# Patient Record
Sex: Female | Born: 1990 | Race: White | Hispanic: No | Smoking: Never smoker
Health system: Southern US, Community
[De-identification: ages and names within clinical notes are randomized; demographics above are authoritative.]

## PROBLEM LIST (undated history)

## (undated) DIAGNOSIS — F32A Depression, unspecified: Secondary | ICD-10-CM

## (undated) DIAGNOSIS — F329 Major depressive disorder, single episode, unspecified: Secondary | ICD-10-CM

## (undated) DIAGNOSIS — O24419 Gestational diabetes mellitus in pregnancy, unspecified control: Secondary | ICD-10-CM

## (undated) DIAGNOSIS — K831 Obstruction of bile duct: Secondary | ICD-10-CM

## (undated) DIAGNOSIS — E282 Polycystic ovarian syndrome: Secondary | ICD-10-CM

## (undated) DIAGNOSIS — O26613 Liver and biliary tract disorders in pregnancy, third trimester: Secondary | ICD-10-CM

## (undated) DIAGNOSIS — Z3490 Encounter for supervision of normal pregnancy, unspecified, unspecified trimester: Secondary | ICD-10-CM

## (undated) DIAGNOSIS — Z8619 Personal history of other infectious and parasitic diseases: Secondary | ICD-10-CM

## (undated) DIAGNOSIS — O26643 Intrahepatic cholestasis of pregnancy, third trimester: Secondary | ICD-10-CM

## (undated) HISTORY — DX: Liver and biliary tract disorders in pregnancy, third trimester: O26.613

## (undated) HISTORY — PX: KIDNEY STONE SURGERY: SHX686

## (undated) HISTORY — DX: Personal history of other infectious and parasitic diseases: Z86.19

## (undated) HISTORY — DX: Intrahepatic cholestasis of pregnancy, third trimester: O26.643

## (undated) HISTORY — PX: CHOLECYSTECTOMY: SHX55

## (undated) HISTORY — PX: WISDOM TOOTH EXTRACTION: SHX21

## (undated) HISTORY — DX: Obstruction of bile duct: K83.1

## (undated) HISTORY — DX: Gestational diabetes mellitus in pregnancy, unspecified control: O24.419

---
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
## 2010-10-03 ENCOUNTER — Other Ambulatory Visit (HOSPITAL_COMMUNITY)
Admission: RE | Admit: 2010-10-03 | Discharge: 2010-10-03 | Disposition: A | Discharge status: Home / Self Care | Payer: Self-pay | Source: Ambulatory Visit | Attending: Family Medicine | Admitting: Family Medicine

## 2010-10-03 ENCOUNTER — Other Ambulatory Visit: Payer: Self-pay

## 2010-10-03 ENCOUNTER — Other Ambulatory Visit (HOSPITAL_COMMUNITY)
Admission: RE | Admit: 2010-10-03 | Discharge: 2010-10-03 | Disposition: A | Discharge status: Home / Self Care | Payer: Self-pay | Source: Ambulatory Visit | Attending: Unknown Physician Specialty | Admitting: Unknown Physician Specialty

## 2010-10-03 DIAGNOSIS — R87612 Low grade squamous intraepithelial lesion on cytologic smear of cervix (LGSIL): Secondary | ICD-10-CM | POA: Insufficient documentation

## 2010-10-03 DIAGNOSIS — R87619 Unspecified abnormal cytological findings in specimens from cervix uteri: Secondary | ICD-10-CM | POA: Insufficient documentation

## 2014-07-28 ENCOUNTER — Inpatient Hospital Stay (HOSPITAL_COMMUNITY)
Admission: RE | Admit: 2014-07-28 | Discharge: 2014-07-30 | DRG: 885 | Disposition: A | Discharge status: Home / Self Care | Payer: 59 | Attending: Psychiatry | Admitting: Psychiatry

## 2014-07-28 ENCOUNTER — Ambulatory Visit (HOSPITAL_COMMUNITY)
Admission: EM | Admit: 2014-07-28 | Discharge: 2014-07-28 | Disposition: A | Discharge status: Home / Self Care | Payer: Self-pay | Source: Home / Self Care | Attending: Psychiatry | Admitting: Psychiatry

## 2014-07-28 ENCOUNTER — Encounter (HOSPITAL_COMMUNITY): Payer: Self-pay | Admitting: *Deleted

## 2014-07-28 DIAGNOSIS — G4700 Insomnia, unspecified: Secondary | ICD-10-CM | POA: Diagnosis present

## 2014-07-28 DIAGNOSIS — F332 Major depressive disorder, recurrent severe without psychotic features: Secondary | ICD-10-CM | POA: Diagnosis present

## 2014-07-28 DIAGNOSIS — R45851 Suicidal ideations: Secondary | ICD-10-CM | POA: Diagnosis present

## 2014-07-28 HISTORY — DX: Polycystic ovarian syndrome: E28.2

## 2014-07-28 LAB — CBC
HEMATOCRIT: 43.5 % (ref 36.0–46.0)
Hemoglobin: 14.7 g/dL (ref 12.0–15.0)
MCH: 30.4 pg (ref 26.0–34.0)
MCHC: 33.8 g/dL (ref 30.0–36.0)
MCV: 90.1 fL (ref 78.0–100.0)
PLATELETS: 326 10*3/uL (ref 150–400)
RBC: 4.83 MIL/uL (ref 3.87–5.11)
RDW: 12.6 % (ref 11.5–15.5)
WBC: 9.2 10*3/uL (ref 4.0–10.5)

## 2014-07-28 LAB — URINALYSIS, ROUTINE W REFLEX MICROSCOPIC
BILIRUBIN URINE: NEGATIVE
Glucose, UA: NEGATIVE mg/dL
Hgb urine dipstick: NEGATIVE
Ketones, ur: NEGATIVE mg/dL
Leukocytes, UA: NEGATIVE
Nitrite: NEGATIVE
Protein, ur: NEGATIVE mg/dL
Specific Gravity, Urine: 1.018 (ref 1.005–1.030)
UROBILINOGEN UA: 0.2 mg/dL (ref 0.0–1.0)
pH: 5.5 (ref 5.0–8.0)

## 2014-07-28 LAB — BASIC METABOLIC PANEL
Anion gap: 9 (ref 5–15)
BUN: 7 mg/dL (ref 6–23)
CALCIUM: 8.7 mg/dL (ref 8.4–10.5)
CO2: 25 mmol/L (ref 19–32)
CREATININE: 0.71 mg/dL (ref 0.50–1.10)
Chloride: 113 mmol/L — ABNORMAL HIGH (ref 96–112)
GFR calc non Af Amer: >90 mL/min (ref >90)
Glucose, Bld: 104 mg/dL — ABNORMAL HIGH (ref 70–99)
Potassium: 3.5 mmol/L (ref 3.5–5.1)
Sodium: 147 mmol/L — ABNORMAL HIGH (ref 135–145)

## 2014-07-28 LAB — LIPID PANEL
CHOL/HDL RATIO: 4.8 ratio
CHOLESTEROL: 159 mg/dL (ref 0–200)
HDL: 33 mg/dL — AB (ref >39)
LDL Cholesterol: 84 mg/dL (ref 0–99)
TRIGLYCERIDES: 211 mg/dL — AB (ref <150)
VLDL: 42 mg/dL — AB (ref 0–40)

## 2014-07-28 LAB — TSH: TSH: 2.349 u[IU]/mL (ref 0.350–4.500)

## 2014-07-28 LAB — HEPATIC FUNCTION PANEL
ALBUMIN: 4 g/dL (ref 3.5–5.2)
ALT: 35 U/L (ref 0–35)
AST: 29 U/L (ref 0–37)
Alkaline Phosphatase: 92 U/L (ref 39–117)
Bilirubin, Direct: 0.1 mg/dL (ref 0.0–0.5)
Indirect Bilirubin: 0.6 mg/dL (ref 0.3–0.9)
Total Bilirubin: 0.7 mg/dL (ref 0.3–1.2)
Total Protein: 7.7 g/dL (ref 6.0–8.3)

## 2014-07-28 LAB — PREGNANCY, URINE: Preg Test, Ur: NEGATIVE

## 2014-07-28 MED ORDER — ACETAMINOPHEN 325 MG PO TABS
650.0000 mg | ORAL_TABLET | Freq: Four times a day (QID) | ORAL | Status: DC | PRN
  Administered 2014-07-28 – 2014-07-29 (×3): 650 mg via ORAL
  Filled 2014-07-28 (×3): qty 2

## 2014-07-28 MED ORDER — TRAZODONE HCL 50 MG PO TABS
50.0000 mg | ORAL_TABLET | Freq: Every evening | ORAL | Status: DC | PRN
  Administered 2014-07-28 – 2014-07-29 (×2): 50 mg via ORAL
  Filled 2014-07-28 (×6): qty 1

## 2014-07-28 MED ORDER — ONDANSETRON 4 MG PO TBDP
4.0000 mg | ORAL_TABLET | Freq: Three times a day (TID) | ORAL | Status: DC | PRN
  Administered 2014-07-28: 4 mg via ORAL
  Filled 2014-07-28: qty 1

## 2014-07-28 MED ORDER — LORAZEPAM 1 MG PO TABS: 1.0000 mg | ORAL_TABLET | Freq: Every day | ORAL | Status: DC

## 2014-07-28 MED ORDER — LORAZEPAM 1 MG PO TABS: 1.0000 mg | ORAL_TABLET | Freq: Three times a day (TID) | ORAL | Status: DC

## 2014-07-28 MED ORDER — HYDROXYZINE HCL 25 MG PO TABS
25.0000 mg | ORAL_TABLET | Freq: Four times a day (QID) | ORAL | Status: DC | PRN
  Administered 2014-07-28 – 2014-07-30 (×2): 25 mg via ORAL
  Filled 2014-07-28 (×2): qty 1
  Filled 2014-07-28: qty 6

## 2014-07-28 MED ORDER — ONDANSETRON 4 MG PO TBDP
4.0000 mg | ORAL_TABLET | Freq: Four times a day (QID) | ORAL | Status: DC | PRN
  Filled 2014-07-28: qty 1

## 2014-07-28 MED ORDER — LORAZEPAM 1 MG PO TABS
1.0000 mg | ORAL_TABLET | Freq: Four times a day (QID) | ORAL | Status: DC
  Filled 2014-07-28 (×5): qty 1

## 2014-07-28 MED ORDER — ALUM & MAG HYDROXIDE-SIMETH 200-200-20 MG/5ML PO SUSP: 30.0000 mL | ORAL | Status: DC | PRN

## 2014-07-28 MED ORDER — HYDROXYZINE HCL 25 MG PO TABS
25.0000 mg | ORAL_TABLET | Freq: Four times a day (QID) | ORAL | Status: DC | PRN
  Filled 2014-07-28: qty 1

## 2014-07-28 MED ORDER — TRAZODONE HCL 50 MG PO TABS
50.0000 mg | ORAL_TABLET | Freq: Every evening | ORAL | Status: DC | PRN
  Filled 2014-07-28 (×2): qty 1

## 2014-07-28 MED ORDER — LORAZEPAM 1 MG PO TABS
1.0000 mg | ORAL_TABLET | Freq: Four times a day (QID) | ORAL | Status: DC | PRN
  Filled 2014-07-28: qty 1

## 2014-07-28 MED ORDER — MAGNESIUM HYDROXIDE 400 MG/5ML PO SUSP: 30.0000 mL | Freq: Every day | ORAL | Status: DC | PRN

## 2014-07-28 MED ORDER — MAGNESIUM HYDROXIDE 400 MG/5ML PO SUSP
30.0000 mL | Freq: Every day | ORAL | Status: DC | PRN
  Filled 2014-07-28: qty 30

## 2014-07-28 MED ORDER — LORAZEPAM 1 MG PO TABS: 1.0000 mg | ORAL_TABLET | Freq: Two times a day (BID) | ORAL | Status: DC

## 2014-07-28 MED ORDER — ALUM & MAG HYDROXIDE-SIMETH 200-200-20 MG/5ML PO SUSP
30.0000 mL | ORAL | Status: DC | PRN
  Filled 2014-07-28: qty 30

## 2014-07-28 MED ORDER — THIAMINE HCL 100 MG/ML IJ SOLN
100.0000 mg | Freq: Once | INTRAMUSCULAR | Status: DC
  Filled 2014-07-28: qty 1

## 2014-07-28 MED ORDER — VITAMIN B-1 100 MG PO TABS
100.0000 mg | ORAL_TABLET | Freq: Every day | ORAL | Status: DC
  Filled 2014-07-28: qty 1

## 2014-07-28 MED ORDER — LOPERAMIDE HCL 2 MG PO CAPS
2.0000 mg | ORAL_CAPSULE | ORAL | Status: DC | PRN
  Filled 2014-07-28: qty 2

## 2014-07-28 MED ORDER — ACETAMINOPHEN 325 MG PO TABS
650.0000 mg | ORAL_TABLET | Freq: Four times a day (QID) | ORAL | Status: DC | PRN
  Filled 2014-07-28: qty 2

## 2014-07-28 MED ORDER — ADULT MULTIVITAMIN W/MINERALS CH
1.0000 | ORAL_TABLET | Freq: Every day | ORAL | Status: DC
  Filled 2014-07-28 (×2): qty 1

## 2014-07-28 MED ORDER — ESCITALOPRAM OXALATE 5 MG PO TABS
5.0000 mg | ORAL_TABLET | Freq: Every day | ORAL | Status: DC
  Administered 2014-07-28 – 2014-07-29 (×2): 5 mg via ORAL
  Filled 2014-07-28 (×3): qty 1
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
## 2014-07-28 NOTE — Tx Team (Signed)
Initial Interdisciplinary Treatment Plan   PATIENT STRESSORS: Marital or family conflict Substance abuse   PATIENT STRENGTHS: Ability for insight Average or above average intelligence Communication skills Motivation for treatment/growth Supportive family/friends Work skills   PROBLEM LIST: Problem List/Patient Goals Date to be addressed Date deferred Reason deferred Estimated date of resolution  Depression "to be put on a antidepressant" 07/28/14   At d/c  anxiety 07/28/14   At d/c  Suicidal ideation 07/28/14   At d/c  Substance abuse 07/28/14   At d/c                                 DISCHARGE CRITERIA:  Improved stabilization in mood, thinking, and/or behavior Motivation to continue treatment in a less acute level of care Need for constant or close observation no longer present  PRELIMINARY DISCHARGE PLAN: Outpatient therapy Return to previous living arrangement Return to previous work or school arrangements  PATIENT/FAMIILY INVOLVEMENT: This treatment plan has been presented to and reviewed with the patient, Lauren Ross. The patient and family have been given the opportunity to ask questions and make suggestions.  Celene KrasRobinson, Khoury Siemon G 07/28/2014, 4:35 AM

## 2014-07-28 NOTE — Progress Notes (Signed)
D: Patient is alert and oriented. Pt's mood and affect is depressed and blunted. Pt is pleasant upon interaction. Pt denies SI/HI and AVH. Pt rates depression 6/10, hopelessness and anxiety 5/10. Pt reports her goal for the day is "accepting my situation." Pt states she received PRN medication earlier today which she experienced relief from. Pt is attending some unit groups. A: Active listening by RN. Encouragement/Support provided to pt. 15 minute checks continued per protocol for patient safety.  R: Patient cooperative and receptive to nursing interventions. Pt remains safe.

## 2014-07-28 NOTE — Clinical Social Work Note (Signed)
CSW contacted pt's place of employment per her request [employer] [phone] in order to inform her supervisor that she is currently hospitalized and will be unable to come to work today. CSW spoke with Dorathy DaftKayla who stated that they would count her as a call out.   The Sherwin-WilliamsHeather Smart, LCSWA 07/28/2014 3:31 PM

## 2014-07-28 NOTE — Progress Notes (Signed)
Adult Psychoeducational Group Note  Date:  07/28/2014 Time:  8:00 pm  Group Topic/Focus:  Wrap-Up Group:   The focus of this group is to help patients review their daily goal of treatment and discuss progress on daily workbooks.  Participation Level:  Active  Participation Quality:  Appropriate, Sharing and Supportive  Affect:  Appropriate  Cognitive:  Appropriate  Insight: Appropriate  Engagement in Group:  Engaged  Modes of Intervention:  Discussion, Education, Socialization and Support  Additional Comments:  Pt stated that her goal for the day was to accept her situation. Pt stated that she feels like she has made progress with the goal. Pt stated that she is feeling better than when she first came into the hospital.   Laural BenesJohnson, Veva HolesLamount 07/28/2014, 11:33 PM
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
## 2014-07-28 NOTE — Progress Notes (Addendum)
D: Pt presents flat in affect and depressed in mood. Pt is currently negative for any SI/HI/AVH. Pt is hoping for things to improve so she can focus on school. Pt reports to be adjusting well to the unit. Pt is visible within milieu but with minimal interactions with others.Pt reports ongoing nausea with no relief from ginger ale.  A: On-cal extender contacted for an order of Zofran. Zofran ordered and administered to pt. Continued support and availability as needed was extended to this pt. Staff continue to monitor pt with q7715min checks.  R: No adverse drug reactions noted. Pt receptive to treatment. Pt remains safe at this time.
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
## 2014-07-28 NOTE — [facility] Group Notes (Signed)
[facility]   07/28/2014 9:20 AM  Participation Quality:  Invited-DID NOT ATTEND. Pt chose to remain in bed.   Smart, American FinancialHeather LCSWA

## 2014-07-28 NOTE — Progress Notes (Signed)
Recreation Therapy Notes  Date: 04.13.2016 Time: 9:30am Location: [address] Group Room   Group Topic: Stress Management  Goal Area(s) Addresses:  Patient will actively participate in stress management techniques presented during session.   Behavioral Response: Did not attend.   Fintan Grater L Sullivan Blasing, LRT/CTRS  Makalynn Berwanger L 07/28/2014 3:17 PM 
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
## 2014-07-28 NOTE — [facility] Group Notes (Signed)
[facility] LCSW Group Therapy  07/28/2014 1:00 PM  Type of Therapy:  Group Therapy  Participation Level:  Active  Participation Quality:  Attentive  Affect:  Appropriate  Cognitive:  Alert and Oriented  Insight:  Engaged  Engagement in Therapy:     Modes of Intervention:  Confrontation, Discussion, Education, Exploration, Problem-solving, Rapport Building, Socialization and Support  Summary of Progress/Problems: Emotion Regulation: This group focused on both positive and negative emotion identification and allowed group members to process ways to identify feelings, regulate negative emotions, and find healthy ways to manage internal/external emotions. Group members were asked to reflect on a time when their reaction to an emotion led to a negative outcome and explored how alternative responses using emotion regulation would have benefited them. Group members were also asked to discuss a time when emotion regulation was utilized when a negative emotion was experienced. Lauren Ross was attentive and engaged during today's processing group. She shared that she struggles with depression and anger that stem from stress. Lauren Ross identified her main stressors as "school and my husband." She became tearful when explaining the lack of communication and feeling unloved by her husband. She shows progress in the group setting and improving insight AEB her ability to identify triggers and bad coping skills (turning to cutting or alcohol use when angry/depressed), and her ability to identify the need for better self care and coping skills. "I need to start seeing a therapist and be put on depression medication."   Smart, Lauren Ross LCSWA 07/28/2014, 1:00 PM

## 2014-07-29 LAB — URINE DRUGS OF ABUSE SCREEN W ALC, ROUTINE (REF LAB)
AMPHETAMINE SCRN UR: NEGATIVE
Barbiturate Quant, Ur: NEGATIVE
Benzodiazepines.: NEGATIVE
COCAINE METABOLITES: NEGATIVE
Creatinine,U: 163.4 mg/dL
Ethyl Alcohol: 31 mg/dL — ABNORMAL HIGH (ref <10)
Marijuana Metabolite: NEGATIVE
Methadone: NEGATIVE
Opiate Screen, Urine: NEGATIVE
PHENCYCLIDINE (PCP): NEGATIVE
PROPOXYPHENE: NEGATIVE

## 2014-07-29 LAB — HEMOGLOBIN A1C
Hgb A1c MFr Bld: 5.6 % (ref 4.8–5.6)
Mean Plasma Glucose: 114 mg/dL

## 2014-07-29 MED ORDER — ESCITALOPRAM OXALATE 10 MG PO TABS
10.0000 mg | ORAL_TABLET | Freq: Every day | ORAL | Status: DC
  Administered 2014-07-30: 10 mg via ORAL
  Filled 2014-07-29 (×2): qty 1

## 2014-07-29 NOTE — [facility] Suicide Risk Assessment (Signed)
[facility] INPATIENT:  Family/Significant Other Suicide Prevention Education  Suicide Prevention Education:  Education Completed; Lauren PattersonMary Ross (pt's mother) [phone] has been identified by the patient as the family member/significant other with whom the patient will be residing, and identified as the person(s) who will aid the patient in the event of a mental health crisis (suicidal ideations/suicide attempt).  With written consent from the patient, the family member/significant other has been provided the following suicide prevention education, prior to the and/or following the discharge of the patient.  The suicide prevention education provided includes the following:  Suicide risk factors  Suicide prevention and interventions  National Suicide Hotline telephone number  [facility] telephone number  [facility] Assistance 911  [facility] and/or [facility]idential Mobile Crisis [facility] telephone number  Request made of family/significant other to:  Remove weapons (e.g., guns, rifles, knives), all items previously/currently identified as safety concern.    Remove drugs/medications (over-the-counter, prescriptions, illicit drugs), all items previously/currently identified as a safety concern.  The family member/significant other verbalizes understanding of the suicide prevention education information provided.  The family member/significant other agrees to remove the items of safety concern listed above.  Smart, Lauren Ross LCSWA  07/29/2014, 3:13 PM
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
## 2014-07-29 NOTE — Progress Notes (Signed)
Patient ID: Lauren Ross, female   DOB: Apr 12, 1991, 24 y.o.   MRN: [id number]  Pt currently presents with a flat affect and depressed behavior. Per self inventory, pt rates depression at a 4, hopelessness 3 and anxiety 2. Pt's daily goal is to "go to group and work towards my normal self" and they intend to do so by "talk and read." Pt reports good sleep/concentration and a fair appetite.   Pt provided with medications per providers orders. Pt's labs and vitals were monitored throughout the day. Pt supported emotionally and encouraged to express concerns and questions. Pt educated on medications and depression.  Pt's safety ensured with 15 minute and environmental checks. Pt currently denies SI/HI and A/V hallucinations. Pt verbally agrees to seek staff if SI/HI or A/VH occurs and to consult with staff before acting on these thoughts.

## 2014-07-29 NOTE — Progress Notes (Signed)
[facility] MD Progress Note  07/29/2014 1:19 PM Lauren Ross  MRN:  [id number] Subjective:  Has not heard from her husband. States she found out that he never told her mother or her brother that she was being admitted as she asked him to do. She states that the frustration has turned into anger. She states she still loves him but does not see a future for their relationship. By some changes in his behavior during the last several months she thinks he is having an affair. She states she is working towards not letting what is going on affect her schooling. She wants to get focused on the plans she has for herself. Principal Problem: MDD (major depressive disorder), recurrent episode, severe Diagnosis:   Patient Active Problem List   Diagnosis Date Noted  . MDD (major depressive disorder), recurrent episode, severe [F33.2] 07/28/2014   Total Time spent with patient: 30 minutes   Past Medical History:  Past Medical History  Diagnosis Date  . Polycystic ovary syndrome    History reviewed. No pertinent past surgical history. Family History: History reviewed. No pertinent family history. Social History:  History  Alcohol Use  . Yes     History  Drug Use Not on file    History   Social History  . Marital Status: Unknown    Spouse Name: N/A  . Number of Children: N/A  . Years of Education: N/A   Social History Main Topics  . Smoking status: Never Smoker   . Smokeless tobacco: Not on file  . Alcohol Use: Yes  . Drug Use: Not on file  . Sexual Activity: Yes   Other Topics Concern  . None   Social History Narrative  . None   Additional History:    Sleep: Fair  Appetite:  Fair   Assessment:   Musculoskeletal: Strength & Muscle Tone: within normal limits Gait & Station: normal Patient leans: N/A   Psychiatric Specialty Exam: Physical Exam  Review of Systems  Constitutional: Negative.   HENT: Negative.   Eyes: Negative.   Respiratory: Negative.    Cardiovascular: Negative.   Gastrointestinal: Negative.   Genitourinary: Negative.   Musculoskeletal: Negative.   Skin: Negative.   Neurological: Negative.   Endo/Heme/Allergies: Negative.   Psychiatric/Behavioral: Positive for depression. The patient is nervous/anxious.     Blood pressure 111/64, pulse 93, temperature 97.7 F (36.5 C), temperature source Oral, resp. rate 16, height _0  (1.575 m), weight 102.059 kg (225 lb), last menstrual period 07/28/2014.Body mass index is 41.14 kg/(m^2).  General Appearance: Fairly Groomed  Engineer, water::  Fair  Speech:  Clear and Coherent  Volume:  fluctuates  Mood:  Anxious and Depressed  Affect:  Restricted  Thought Process:  Coherent and Goal Directed  Orientation:  Full (Time, Place, and Person)  Thought Content:  symptoms events worries concerns  Suicidal Thoughts:  No  Homicidal Thoughts:  No  Memory:  Immediate;   Fair Recent;   Fair Remote;   Fair  Judgement:  Fair  Insight:  Present  Psychomotor Activity:  Normal  Concentration:  Fair  Recall:  [employer] of Knowledge:Fair  Language: Fair  Akathisia:  No  Handed:  Right  AIMS (if indicated):     Assets:  Desire for Improvement Housing Social Support Vocational/Educational  ADL's:  Intact  Cognition: WNL  Sleep:  Number of Hours: 6.75     Current Medications: Current Facility-Administered Medications  Medication Dose Route Frequency Provider Last Rate Last Dose  .  acetaminophen (TYLENOL) tablet 650 mg  650 mg Oral Q6H PRN Laverle Hobby, PA-C   650 mg at 07/29/14 1214  . alum & mag hydroxide-simeth (MAALOX/MYLANTA) 200-200-20 MG/5ML suspension 30 mL  30 mL Oral Q4H PRN Laverle Hobby, PA-C      . escitalopram (LEXAPRO) tablet 5 mg  5 mg Oral Daily Nicholaus Bloom, MD   5 mg at 07/29/14 4854  . hydrOXYzine (ATARAX/VISTARIL) tablet 25 mg  25 mg Oral Q6H PRN Laverle Hobby, PA-C   25 mg at 07/28/14 1302  . magnesium hydroxide (MILK OF MAGNESIA) suspension 30 mL  30 mL  Oral Daily PRN Laverle Hobby, PA-C      . ondansetron (ZOFRAN-ODT) disintegrating tablet 4 mg  4 mg Oral Q8H PRN Laverle Hobby, PA-C   4 mg at 07/28/14 2204  . traZODone (DESYREL) tablet 50 mg  50 mg Oral QHS,MR X 1 Laverle Hobby, PA-C   50 mg at 07/28/14 2204    Lab Results:  Results for orders placed or performed during the hospital encounter of 07/28/14 (from the past 48 hour(s))  Basic metabolic panel     Status: Abnormal   Collection Time: 07/28/14  6:26 AM  Result Value Ref Range   Sodium 147 (H) 135 - 145 mmol/L   Potassium 3.5 3.5 - 5.1 mmol/L   Chloride 113 (H) 96 - 112 mmol/L   CO2 25 19 - 32 mmol/L   Glucose, Bld 104 (H) 70 - 99 mg/dL   BUN 7 6 - 23 mg/dL   Creatinine, Ser 0.71 0.50 - 1.10 mg/dL   Calcium 8.7 8.4 - 10.5 mg/dL   GFR calc non Af Amer >90 >90 mL/min   GFR calc Af Amer >90 >90 mL/min    Comment: (NOTE) The eGFR has been calculated using the CKD EPI equation. This calculation has not been validated in all clinical situations. eGFR's persistently <90 mL/min signify possible Chronic Kidney Disease.    Anion gap 9 5 - 15    Comment: Performed at [facility]  CBC     Status: None   Collection Time: 07/28/14  6:26 AM  Result Value Ref Range   WBC 9.2 4.0 - 10.5 K/uL   RBC 4.83 3.87 - 5.11 MIL/uL   Hemoglobin 14.7 12.0 - 15.0 g/dL   HCT 43.5 36.0 - 46.0 %   MCV 90.1 78.0 - 100.0 fL   MCH 30.4 26.0 - 34.0 pg   MCHC 33.8 30.0 - 36.0 g/dL   RDW 12.6 11.5 - 15.5 %   Platelets 326 150 - 400 K/uL    Comment: Performed at [facility]  TSH     Status: None   Collection Time: 07/28/14  6:26 AM  Result Value Ref Range   TSH 2.349 0.350 - 4.500 uIU/mL    Comment: Performed at [facility]  Lipid panel     Status: Abnormal   Collection Time: 07/28/14  6:26 AM  Result Value Ref Range   Cholesterol 159 0 - 200 mg/dL   Triglycerides 211 (H) <150 mg/dL   HDL 33 (L) >39 mg/dL   Total CHOL/HDL Ratio 4.8  RATIO   VLDL 42 (H) 0 - 40 mg/dL   LDL Cholesterol 84 0 - 99 mg/dL    Comment:        Total Cholesterol/HDL:CHD Risk Coronary Heart Disease Risk Table  Men   Women  1/2 Average Risk   3.4   3.3  Average Risk       5.0   4.4  2 X Average Risk   9.6   7.1  3 X Average Risk  23.4   11.0        Use the calculated Patient Ratio above and the CHD Risk Table to determine the patient's CHD Risk.        ATP III CLASSIFICATION (LDL):  <100     mg/dL   Optimal  100-129  mg/dL   Near or Above                    Optimal  130-159  mg/dL   Borderline  160-189  mg/dL   High  >190     mg/dL   Very High Performed at [facility]   Hemoglobin A1c     Status: None   Collection Time: 07/28/14  6:26 AM  Result Value Ref Range   Hgb A1c MFr Bld 5.6 4.8 - 5.6 %    Comment: (NOTE)         Pre-diabetes: 5.7 - 6.4         Diabetes: >6.4         Glycemic control for adults with diabetes: <7.0    Mean Plasma Glucose 114 mg/dL    Comment: (NOTE) Performed At: [facility] [place], [place] [id number] Lindon Romp MD IY:[id number] Performed at [facility]   Hepatic function panel     Status: None   Collection Time: 07/28/14  6:26 AM  Result Value Ref Range   Total Protein 7.7 6.0 - 8.3 g/dL   Albumin 4.0 3.5 - 5.2 g/dL   AST 29 0 - 37 U/L   ALT 35 0 - 35 U/L   Alkaline Phosphatase 92 39 - 117 U/L   Total Bilirubin 0.7 0.3 - 1.2 mg/dL   Bilirubin, Direct 0.1 0.0 - 0.5 mg/dL   Indirect Bilirubin 0.6 0.3 - 0.9 mg/dL    Comment: Performed at [facility]  Drugs of abuse scrn w alc, routine urine (ref lab)     Status: Abnormal   Collection Time: 07/28/14  6:33 AM  Result Value Ref Range   Amphetamine Screen, Ur NEGATIVE Negative   Marijuana Metabolite NEGATIVE Negative   Barbiturate Quant, Ur NEGATIVE Negative   Methadone NEGATIVE Negative   Propoxyphene NEGATIVE Negative   Benzodiazepines. NEGATIVE  Negative   Phencyclidine (PCP) NEGATIVE Negative   Cocaine Metabolites NEGATIVE Negative   Opiate Screen, Urine NEGATIVE Negative   Ethyl Alcohol 31 (H) <10 mg/dL    Comment: Sent for confirmatory testing   Creatinine,U 163.4 mg/dL    Comment: (NOTE) Cutoff Values for Urine Drug Screen:        Drug Class           Cutoff (ng/mL)        Amphetamines            1000        Barbiturates             200        Cocaine Metabolites      300        Benzodiazepines          200        Methadone  300        Opiates                 2000        Phencyclidine             25        Propoxyphene             300        Marijuana Metabolites     50 For medical purposes only. Performed at [facility]   Pregnancy, urine     Status: None   Collection Time: 07/28/14  6:33 AM  Result Value Ref Range   Preg Test, Ur NEGATIVE NEGATIVE    Comment:        THE SENSITIVITY OF THIS METHODOLOGY IS >20 mIU/mL. Performed at [facility]   Urinalysis, Routine w reflex microscopic     Status: None   Collection Time: 07/28/14  6:33 AM  Result Value Ref Range   Color, Urine YELLOW YELLOW   APPearance CLEAR CLEAR   Specific Gravity, Urine 1.018 1.005 - 1.030   pH 5.5 5.0 - 8.0   Glucose, UA NEGATIVE NEGATIVE mg/dL   Hgb urine dipstick NEGATIVE NEGATIVE   Bilirubin Urine NEGATIVE NEGATIVE   Ketones, ur NEGATIVE NEGATIVE mg/dL   Protein, ur NEGATIVE NEGATIVE mg/dL   Urobilinogen, UA 0.2 0.0 - 1.0 mg/dL   Nitrite NEGATIVE NEGATIVE   Leukocytes, UA NEGATIVE NEGATIVE    Comment: MICROSCOPIC NOT DONE ON URINES WITH NEGATIVE PROTEIN, BLOOD, LEUKOCYTES, NITRITE, OR GLUCOSE <1000 mg/dL. Performed at [facility]     Physical Findings: AIMS: Facial and Oral Movements Muscles of Facial Expression: None, normal Lips and Perioral Area: None, normal Jaw: None, normal Tongue: None, normal,Extremity Movements Upper (arms, wrists, hands, fingers): None,  normal Lower (legs, knees, ankles, toes): None, normal, Trunk Movements Neck, shoulders, hips: None, normal, Overall Severity Severity of abnormal movements (highest score from questions above): None, normal Incapacitation due to abnormal movements: None, normal Patient's awareness of abnormal movements (rate only patient's report): No Awareness, Dental Status Current problems with teeth and/or dentures?: No Does patient usually wear dentures?: No  CIWA:    COWS:     Treatment Plan Summary: Daily contact with patient to assess and evaluate symptoms and progress in treatment and Medication management Supportive approach/coping skills Major Depression; pursue the Lexapro, increase to 10 mg daily CBT/mindfulness/develop alternatives to using alcohol to cope  Help deal with the imminent loss of the relationship with her husband Grief-loss   Medical Decision Making:  Review of Psycho-Social Stressors (1), Review or order clinical lab tests (1), Review of Medication Regimen & Side Effects (2) and Review of New Medication or Change in Dosage (2)     Altus Zaino A 07/29/2014, 1:19 PM

## 2014-07-29 NOTE — Progress Notes (Signed)
Patient ID: Lauren Ross, female   DOB: [birth year], 24 y.o.   MRN: [id number] Adult Psychoeducational Group Note  Date:  07/29/2014 Time: 09:15 am  Group Topic/Focus:  Orientation:   The focus of this group is to educate the patient on the purpose and policies of crisis stabilization and provide a format to answer questions about their admission.  The group details unit policies and expectations of patients while admitted. Self Esteem Action Plan:   The focus of this group is to help patients create a plan to continue to build self-esteem after discharge.  Participation Level:  Active  Participation Quality:  Appropriate  Affect:  Flat  Cognitive:  Appropriate  Insight: Improving  Engagement in Group:  Engaged  Modes of Intervention:  Activity, Discussion, Education, Orientation and Support  Additional Comments:  Pt able to identify one positive aspect of self and a positive coping skill to utilize after discharge.   [name], [name] E 07/29/2014, 10:32 AM

## 2014-07-29 NOTE — Progress Notes (Signed)
Patient did attend the evening karaoke group. Pt was engaged and supportive but did not participate by singing a song.    

## 2014-07-29 NOTE — Progress Notes (Signed)
Recreation Therapy Notes  Animal-Assisted Activity (AAA) Program Checklist/Progress Notes Patient Eligibility Criteria Checklist & Daily Group note for Rec Tx Intervention  Date: 04.14.2016 Time: 2:45pm Location: [address] Hall Dayroom   AAA/T Program Assumption of Risk Form signed by Patient/ or Parent Legal Guardian yes  Patient is free of allergies or sever asthma yes  Patient reports no fear of animals yes  Patient reports no history of cruelty to animals yes  Patient understands his/her participation is voluntary yes  Patient washes hands before animal contact yes  Patient washes hands after animal contact yes  Behavioral Response: Engaged, Attentive  Education: Hand Washing, Appropriate Animal Interaction   Education Outcome: Acknowledges education.   Clinical Observations/Feedback: Patient actively engaged with dog team, petting therapy dog appropriately from floor level and feeding him treats in exchange for execution of basic obedience commands.   Lauren Ross Lauren Ross, LRT/CTRS  Lauren Ross 07/29/2014 4:46 PM 

## 2014-07-30 DIAGNOSIS — F332 Major depressive disorder, recurrent severe without psychotic features: Secondary | ICD-10-CM | POA: Insufficient documentation

## 2014-07-30 MED ORDER — TRAZODONE HCL 50 MG PO TABS: 50.0000 mg | ORAL_TABLET | Freq: Every evening | ORAL | Status: DC | PRN

## 2014-07-30 MED ORDER — ESCITALOPRAM OXALATE 10 MG PO TABS: 10.0000 mg | ORAL_TABLET | Freq: Every day | ORAL | Status: DC

## 2014-07-30 MED ORDER — HYDROXYZINE HCL 25 MG PO TABS: 25.0000 mg | ORAL_TABLET | Freq: Four times a day (QID) | ORAL | Status: DC | PRN

## 2014-07-30 NOTE — BHH Group Notes (Signed)
[facility] Note   07/30/2014 9:26 AM  Participation Quality:  Appropriate   Mood/Affect:  Appropriate  Depression Rating:  2  Anxiety Rating:  3  Thoughts of Suicide:  No Will you contract for safety?   NA  Current AVH:  No  Plan for Discharge/Comments:  Pt reports that she is looking forward to d/cing today and plans to stay with her brother and is going to leave her husband. Pt has follow-up with [facility] for med management and has [facility] and med management-she is aware that she must [facility] once she gets appt [facility].   Transportation Means: mother  Supports: mother, brother  Smart, Lebron QuamHeather LCSWA

## 2014-07-30 NOTE — Progress Notes (Signed)
Patient discharged home per MD order.  Patient received all personal belongings, prescriptions and medication samples.  She denies SI/HI/AVH.  Patient left ambulatory with her mother.

## 2014-07-30 NOTE — Progress Notes (Signed)
Patient ID: Lauren Ross, female   DOB: April 23, 1990, 24 y.o.   MRN: [id number]  D: Patient pleasant and cooperative with care. Pt states she is ready to go home and only rates her depression 1/10. Pt interacting well with peers on unit. A: Q 15 minute safety checks, encourage staff/peer interaction and group participation. Administer medications as ordered by provider. R: Patient denies SI or plans to harm herself at this time. Pt participated in group session. No s/s of distress noted.

## 2014-07-30 NOTE — Progress Notes (Addendum)
[name]  is up first thing this morning and is seen sitting in the dayroom watching the new. She presents with a flat, depressed affect and when she is asked about this, she says " oh I'm always like this..". She takes her scheduled lexapro as ordered and denies need for additional prn medicaitons.   A She completed her morning assessment and on it she wrote she denied experiencing SI today and she rated her depression, hopelessness and anxiety " 3/3/2", respectively. When this nurse asked her about being discharged today, she responded " I don't know....did they say I was going home today?". We spoke about this and she says her DC plan is in place-- ( she is planning for her mother to pick her up at discharge  and she says she will be " staying with my brother". ) We went on to talk about her relationship with her husband ( whom she now refers to as her " x") and she explains she realizes, now, that her relationship was over " long before I came into the hospital". 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 She says she has already spoken to the social worker about [facility] to [facility] to her school, so her standing in her class will not be damaged and / or compromised. ( This documentation is providd to her by Child psychotherapistsocial worker and has been placed on her shadow chart, to be given to her at DC time. SHe states she is safe and " ready to go home".    R Safety is in place.

## 2014-07-30 NOTE — BHH Suicide Risk Assessment (Signed)
[facility] Discharge Suicide Risk Assessment   Demographic Factors:  Caucasian Young adult   Total Time spent with patient: 30 minutes  Musculoskeletal: Strength & Muscle Tone: within normal limits Gait & Station: normal Patient leans: N/A  Psychiatric Specialty Exam: Physical Exam  Review of Systems  Constitutional: Negative.   HENT: Negative.   Eyes: Negative.   Respiratory: Negative.   Cardiovascular: Negative.   Gastrointestinal: Negative.   Genitourinary: Negative.   Musculoskeletal: Negative.   Skin: Negative.   Neurological: Negative.   Endo/Heme/Allergies: Negative.   Psychiatric/Behavioral: Positive for depression. The patient is nervous/anxious.     Blood pressure 111/54, pulse 77, temperature 98 F (36.7 C), temperature source Oral, resp. rate 16, height  (1.575 m), weight 102.059 kg (225 lb), last menstrual period 07/28/2014.Body mass index is 41.14 kg/(m^2).  General Appearance: Fairly Groomed  Patent attorney::  Fair  Speech:  Clear and Coherent409  Volume:  Normal  Mood:  Anxious and worried  Affect:  Appropriate  Thought Process:  Coherent and Goal Directed  Orientation:  Full (Time, Place, and Person)  Thought Content:  plans as she moves on  Suicidal Thoughts:  No  Homicidal Thoughts:  No  Memory:  Immediate;   Fair Recent;   Fair Remote;   Fair  Judgement:  Fair  Insight:  Present  Psychomotor Activity:  Normal  Concentration:  Fair  Recall:  [employer] of Knowledge:Fair  Language: Fair  Akathisia:  No  Handed:  Right  AIMS (if indicated):     Assets:  Desire for Improvement Housing Social Support Vocational/Educational  Sleep:  Number of Hours: 6.25  Cognition: WNL  ADL's:  Intact   Have you used any form of tobacco in the last 30 days? (Cigarettes, Smokeless Tobacco, Cigars, and/or Pipes): No  Has this patient used any form of tobacco in the last 30 days? (Cigarettes, Smokeless Tobacco, Cigars, and/or Pipes) No  Mental Status Per  Nursing Assessment::   On Admission:  Self-harm behaviors  Current Mental Status by Physician: In full contact with reality. There are no active SI plans or intent. Her mood is euthymic her affect is appropriate. She is willing and motivated to pursue outpatient treatment. States she has accepted that the relationship with her husband is over. She is going to focus on her studies   Loss Factors: Loss of significant relationship  Historical Factors: NA  Risk Reduction Factors:   Sense of responsibility to family, Employed, Living with another person, especially a relative and Positive social support  Continued Clinical Symptoms:  Depression:   Impulsivity  Cognitive Features That Contribute To Risk:  None    Suicide Risk:  Minimal: No identifiable suicidal ideation.  Patients presenting with no risk factors but with morbid ruminations; may be classified as minimal risk based on the severity of the depressive symptoms  Principal Problem: MDD (major depressive disorder), recurrent episode, severe Discharge Diagnoses:  Patient Active Problem List   Diagnosis Date Noted  . MDD (major depressive disorder), recurrent episode, severe [F33.2] 07/28/2014    Follow-up Information    Follow up with [facility] .   Why:  Agency will contact you directly at discharge to schedule appt for med managment and therapy. Please call agency if you do not receive call by Monday. Thank you!   Contact information:   [address] [place], [place] [address] Phone: [phone] Fax: [phone]      Follow up with [facility] On 09/02/2014.   Why:  Appt for medication management on this date at 1:00PM with Dr. Michae KavaAgarwal. PLEASE CANCEL THIS APPT IF/WHEN YOU GET FOLLOW-UP APPTS IN PLACE WITH [facility] FOR MEDICATION MANAGEMENT AND THERAPY. THANK YOU!    Contact information:   [address] [place], [place] [address] Phone: [phone] Fax: [phone]       Plan Of Care/Follow-up recommendations:  Activity:  as tolerated Diet:  regular [facility] as above Is patient on multiple antipsychotic therapies at discharge:  No   Has Patient had three or more failed trials of antipsychotic monotherapy by history:  No  Recommended Plan for Multiple Antipsychotic Therapies: NA    Cutler Sunday A 07/30/2014, 1:08 PM

## 2014-07-30 NOTE — Progress Notes (Signed)
  [facility] :  Will you be returning to the same living situation after discharge:  No.Going to live with her brother At discharge, do you have transportation home?: Yes,  pt's mother will pick her up after lunch. Do you have the ability to pay for your medications: Yes,  [employer] private insurance  Release of information consent forms completed and submitted to Medical Records by CSW.   Patient to Follow up at: Follow-up Information    Follow [facility] with [facility] .   Why:  Agency will contact you directly at discharge to schedule appt for med managment and therapy. Please call agency if you do not receive call by Monday. Thank you!   Contact information:   [address] [place], [place] [address] Phone: [phone] Fax: [phone]      [facility]/2014.   Why:  Appt for medication management on this date at 1:00PM with Dr. Michae KavaAgarwal. PLEASE CANCEL THIS APPT IF/WHEN YOU GET FOLLOW-UP APPTS IN PLACE WITH [facility] FOR MEDICATION MANAGEMENT AND THERAPY. THANK YOU!    Contact information:   [address] [place], [place] [address] Phone: [phone] Fax: [phone]      Patient denies SI/HI: Yes,  during group/self report.    Safety Planning and Suicide Prevention discussed: Yes, SPE completed with pt's mother. SPI pamphlet provided to pt and mobile crisis info provided to pt.   Have you used any form of tobacco in the last 30 days? (Cigarettes, Smokeless Tobacco, Cigars, and/or Pipes): No  Has patient been referred to the Quitline?: N/A patient is not a smoker  Smart, BrendaHeather LCSWA  07/30/2014, 9:28 AM
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
## 2014-08-03 NOTE — Progress Notes (Signed)
Patient Discharge Instructions:  After Visit Summary (AVS):   Faxed to:  08/03/14 Discharge Summary Note:   Faxed to:  08/03/14 Psychiatric Admission Assessment Note:   Faxed to:  08/03/14 Suicide Risk Assessment - Discharge Assessment:   Faxed to:  08/03/14 Faxed/Sent to the Next Level Care provider:  08/03/14 Next Level Care Provider Has Access to the EMR, 08/03/14  Fax[facility] to [facility] @  [phone] Records provided to [facility] via CHL/Epic access.  Jerelene ReddenSheena E Maplewood, 08/03/2014, 3:37 PM

## 2014-09-02 ENCOUNTER — Ambulatory Visit (HOSPITAL_COMMUNITY): Payer: Self-pay | Admitting: Psychiatry

## 2014-12-21 ENCOUNTER — Encounter (HOSPITAL_COMMUNITY): Payer: Self-pay | Admitting: Emergency Medicine

## 2014-12-21 ENCOUNTER — Emergency Department (HOSPITAL_COMMUNITY): Payer: Managed Care, Other (non HMO)

## 2014-12-21 ENCOUNTER — Emergency Department (HOSPITAL_COMMUNITY)
Admission: EM | Admit: 2014-12-21 | Discharge: 2014-12-21 | Disposition: A | Discharge status: Home / Self Care | Payer: Managed Care, Other (non HMO) | Attending: Emergency Medicine | Admitting: Emergency Medicine

## 2014-12-21 DIAGNOSIS — Z79899 Other long term (current) drug therapy: Secondary | ICD-10-CM | POA: Diagnosis not present

## 2014-12-21 DIAGNOSIS — R109 Unspecified abdominal pain: Secondary | ICD-10-CM | POA: Diagnosis present

## 2014-12-21 DIAGNOSIS — Z3202 Encounter for pregnancy test, result negative: Secondary | ICD-10-CM | POA: Insufficient documentation

## 2014-12-21 DIAGNOSIS — Z8639 Personal history of other endocrine, nutritional and metabolic disease: Secondary | ICD-10-CM | POA: Diagnosis not present

## 2014-12-21 DIAGNOSIS — N390 Urinary tract infection, site not specified: Secondary | ICD-10-CM | POA: Diagnosis not present

## 2014-12-21 DIAGNOSIS — F329 Major depressive disorder, single episode, unspecified: Secondary | ICD-10-CM | POA: Insufficient documentation

## 2014-12-21 HISTORY — DX: Major depressive disorder, single episode, unspecified: F32.9

## 2014-12-21 HISTORY — DX: Depression, unspecified: F32.A

## 2014-12-21 LAB — URINALYSIS, ROUTINE W REFLEX MICROSCOPIC
Bilirubin Urine: NEGATIVE
GLUCOSE, UA: NEGATIVE mg/dL
KETONES UR: NEGATIVE mg/dL
Nitrite: NEGATIVE
PH: 7 (ref 5.0–8.0)
Protein, ur: 100 mg/dL — AB
SPECIFIC GRAVITY, URINE: 1.015 (ref 1.005–1.030)
Urobilinogen, UA: 0.2 mg/dL (ref 0.0–1.0)

## 2014-12-21 LAB — URINE MICROSCOPIC-ADD ON

## 2014-12-21 LAB — POC URINE PREG, ED: Preg Test, Ur: NEGATIVE

## 2014-12-21 MED ORDER — OXYCODONE-ACETAMINOPHEN 5-325 MG PO TABS
ORAL_TABLET | ORAL | Status: AC
  Filled 2014-12-21: qty 1

## 2014-12-21 MED ORDER — CEPHALEXIN 500 MG PO CAPS: 500.0000 mg | ORAL_CAPSULE | Freq: Four times a day (QID) | ORAL | Status: DC

## 2014-12-21 MED ORDER — OXYCODONE-ACETAMINOPHEN 5-325 MG PO TABS
1.0000 | ORAL_TABLET | Freq: Once | ORAL | Status: AC
  Administered 2014-12-21: 1 via ORAL

## 2014-12-21 MED ORDER — ONDANSETRON 4 MG PO TBDP
4.0000 mg | ORAL_TABLET | Freq: Once | ORAL | Status: AC | PRN
  Administered 2014-12-21: 4 mg via ORAL

## 2014-12-21 MED ORDER — ONDANSETRON 4 MG PO TBDP
ORAL_TABLET | ORAL | Status: AC
  Filled 2014-12-21: qty 1

## 2014-12-21 NOTE — ED Notes (Signed)
Patient transported to CT 

## 2014-12-21 NOTE — ED Notes (Signed)
Pt sts dysuria and bilateral flank pain with some fever; pt sts treated 1 month ago for UTI and not sure if resolved

## 2014-12-21 NOTE — ED Provider Notes (Signed)
CSN: [id number]     Arrival date & time 12/21/14  1815 History   First MD Initiated Contact with Patient 12/21/14 1946     Chief Complaint  Patient presents with  . Flank Pain  . Urinary Tract Infection     (Consider location/radiation/quality/duration/timing/severity/associated sxs/prior Treatment) HPI Comments: Patient with PMH of KS and UTI presents to the ED with a chief complaint of dysuria and right sided flank pain.  She states that the pain is a 9/10.  This feels similar to when she had a KS several years ago.  She states that she did require a stent in order to pass the stone.  She states that she has had some dysuria for the past week or so, but the flank pain started acutely on Saturday. Denies fevers or nausea.  She has recently taken cipro for UTI.  There are no aggravating or alleviating factors.    The history is provided by the patient. No language interpreter was used.    Past Medical History  Diagnosis Date  . Polycystic ovary syndrome   . Depression    History reviewed. No pertinent past surgical history. History reviewed. No pertinent family history. Social History  Substance Use Topics  . Smoking status: Never Smoker   . Smokeless tobacco: None  . Alcohol Use: Yes   OB History    No data available     Review of Systems  Constitutional: Negative for fever and chills.  Respiratory: Negative for shortness of breath.   Cardiovascular: Negative for chest pain.  Gastrointestinal: Negative for nausea, vomiting, diarrhea and constipation.  Genitourinary: Positive for dysuria and flank pain.      Allergies  Bee venom  Home Medications   Prior to Admission medications   Medication Sig Start Date End Date Taking? Authorizing Provider  escitalopram (LEXAPRO) 10 MG tablet Take 1 tablet (10 mg total) by mouth daily. 07/30/14   Adonis Brook, NP  hydrOXYzine (ATARAX/VISTARIL) 25 MG tablet Take 1 tablet (25 mg total) by mouth every 6 (six) hours as needed for  anxiety. 07/30/14   Adonis Brook, NP  traZODone (DESYREL) 50 MG tablet Take 1 tablet (50 mg total) by mouth at bedtime and may repeat dose one time if needed. 07/30/14   Adonis Brook, NP   BP 126/89 mmHg  Pulse 104  Temp(Src) 98.2 F (36.8 C) (Oral)  Resp 18  SpO2 98% Physical Exam  Constitutional: She is oriented to person, place, and time. She appears well-developed and well-nourished.  HENT:  Head: Normocephalic and atraumatic.  Eyes: Conjunctivae and EOM are normal. Pupils are equal, round, and reactive to light.  Neck: Normal range of motion. Neck supple.  Cardiovascular: Normal rate and regular rhythm.  Exam reveals no gallop and no friction rub.   No murmur heard. Pulmonary/Chest: Effort normal and breath sounds normal. No respiratory distress. She has no wheezes. She has no rales. She exhibits no tenderness.  Abdominal: Soft. Bowel sounds are normal. She exhibits no distension and no mass. There is no tenderness. There is no rebound and no guarding.  Right CVA tenderness  No focal abdominal tenderness, no RLQ tenderness or pain at McBurney's point, no RUQ tenderness or Murphy's sign, no left-sided abdominal tenderness, no fluid wave, or signs of peritonitis   Musculoskeletal: Normal range of motion. She exhibits no edema or tenderness.  Neurological: She is alert and oriented to person, place, and time.  Skin: Skin is warm and dry.  Psychiatric: She has a normal mood  and affect. Her behavior is normal. Judgment and thought content normal.  Nursing note and vitals reviewed.   ED Course  Procedures (including critical care time) Results for orders placed or performed during the hospital encounter of 12/21/14  Urinalysis, Routine w reflex microscopic (not at [facility])  Result Value Ref Range   Color, Urine YELLOW YELLOW   APPearance CLOUDY (A) CLEAR   Specific Gravity, Urine 1.015 1.005 - 1.030   pH 7.0 5.0 - 8.0   Glucose, UA NEGATIVE NEGATIVE mg/dL   Hgb urine dipstick  SMALL (A) NEGATIVE   Bilirubin Urine NEGATIVE NEGATIVE   Ketones, ur NEGATIVE NEGATIVE mg/dL   Protein, ur 409 (A) NEGATIVE mg/dL   Urobilinogen, UA 0.2 0.0 - 1.0 mg/dL   Nitrite NEGATIVE NEGATIVE   Leukocytes, UA MODERATE (A) NEGATIVE  Urine microscopic-add on  Result Value Ref Range   WBC, UA 3-6 <3 WBC/hpf   RBC / HPF 0-2 <3 RBC/hpf   Bacteria, UA RARE RARE  POC urine preg, ED (not at [facility])  Result Value Ref Range   Preg Test, Ur NEGATIVE NEGATIVE   Ct Abdomen Pelvis Wo Contrast  12/21/2014   CLINICAL DATA:  Right flank pain, gross hematuria.  EXAM: CT ABDOMEN AND PELVIS WITHOUT CONTRAST  TECHNIQUE: Multidetector CT imaging of the abdomen and pelvis was performed following the standard protocol without IV contrast.  COMPARISON:  None.  FINDINGS: Visualized lung bases are unremarkable. No significant osseous abnormality is noted.  Status post cholecystectomy. No focal abnormality is noted in the liver, spleen or pancreas on these unenhanced images. Adrenal glands and kidneys appear normal. No hydronephrosis or renal obstruction is noted. The appendix appears normal. There is no evidence of bowel obstruction. No abnormal fluid collection is noted. Urinary bladder appears normal. Uterus and ovaries are unremarkable. No significant adenopathy is noted.  IMPRESSION: Status post cholecystectomy. No hydronephrosis or renal obstruction is noted. No renal or ureteral calculi are noted. No acute abnormality is noted in the abdomen or pelvis.   Electronically Signed   By: Lupita Raider, M.D.   On: 12/21/2014 21:33      MDM   Final diagnoses:  UTI (lower urinary tract infection)  Flank pain    Patient with dysuria and flank pain.  Probable UTI, however patient does have a hx of KS and has had to have a stent in the past.  Given that patient's pain is the same as she remembers it being when she had a KS, will check CT so as to rule out infected stone.    CT negative.  Will treat for UTI with  keflex. No fever or nausea.  Doubt pyelo. Urine sent for culture.  OTC meds for pain.  Recommend increasing fluids.  Patient understands and agrees with the plan.  She is stable and ready for discharge.    Roxy Horseman, PA-C 12/21/14 8119  Marily Memos, MD 12/22/14 [phone]

## 2014-12-21 NOTE — Discharge Instructions (Signed)

## 2014-12-23 LAB — URINE CULTURE

## 2014-12-24 ENCOUNTER — Telehealth (HOSPITAL_BASED_OUTPATIENT_CLINIC_OR_DEPARTMENT_OTHER): Payer: Self-pay | Admitting: Emergency Medicine

## 2014-12-24 NOTE — Telephone Encounter (Signed)
Post ED Visit - Positive Culture Follow-up  Culture report reviewed by antimicrobial stewardship pharmacist:   Celedonio Miyamoto, Pharm.D., BCPS  Georgina Pillion, [address].D., BCPS  [place], [address].D., BCPS, AAHIVP  Estella Husk, Pharm.D., BCPS, AAHIVP  [employer], [address].D.  Tennis Must, Pharm.D.  Positive urine culture Staphylococcus Treated with cephalexin, organism sensitive to the same and no further patient follow-up is required at this time.  Berle Mull 12/24/2014, 10:36 AM

## 2015-04-17 NOTE — L&D Delivery Note (Addendum)
Cesarean Section Procedure Note  Indications: failure to progress: arrest of dilation  Pre-operative Diagnosis: 37 week 2 day pregnancy. GDMA2, cholestasis of pregnancy  Post-operative Diagnosis: same  Surgeon: Toshi Ishii Worema Jerriyah Louis   Assistants: 2nd scrub  Anesthesia: Epidural anesthesia  Arrest of descent at 5.5cm dilation for 9hours; afeb, cat 2 strip    Procedure Details   The patient was seen in the Holding Room. The risks, benefits, complications, treatment options, and expected outcomes were discussed with the patient.  The patient concurred with the proposed plan, giving informed consent.  The site of surgery properly noted/marked. The patient was taken to Operating Room # 1, identified as Lauren Ross and the procedure verified as C-Section Delivery. A Time Out was held and the above information confirmed.  After induction of anesthesia, the patient was draped and prepped in the usual sterile manner. A Pfannenstiel incision was made and carried down through the subcutaneous tissue to the fascia. Fascial incision was made and extended transversely. The fascia was separated from the underlying rectus tissue superiorly and inferiorly. The peritoneum was identified and entered. Peritoneal incision was extended longitudinally.Alexis was placed.  The utero-vesical peritoneal reflection was incised transversely and the bladder flap was bluntly freed from the lower uterine segment. A low transverse uterine incision was made. Delivered from cephalic presentation was a  Female with Apgar scores of 8 at one minute and 9 at five minutes. After the umbilical cord was clamped and cut cord blood was obtained for evaluation. The placenta was removed intact and appeared normal. The uterine outline, tubes and ovaries appeared normal. The uterine incision was closed with running locked sutures of Chromic. Hemostasis was observed. Lavage was carried out until clear. The fascia was then  reapproximated with running sutures of o vicryl. The skin was reapproximated with 4-0 vicryl.  Instrument, sponge, and needle counts were correct prior the abdominal closure and at the conclusion of the case.   Findings: Viable female infant ; grossly nl uterus tubes and ovaries  Estimated Blood Loss:  800         Drains: none         Total IV Fluids:  1600ml         Specimens: Placenta to L/D          Implants: none          Complications:  Pain control - given ketamine          Disposition: PACU - hemodynamically stable.         Condition: stable  Attending Attestation: I was present and scrubbed for the entire procedure.             

## 2015-06-03 ENCOUNTER — Emergency Department (INDEPENDENT_AMBULATORY_CARE_PROVIDER_SITE_OTHER)
Admission: EM | Admit: 2015-06-03 | Discharge: 2015-06-03 | Disposition: A | Discharge status: Home / Self Care | Payer: Managed Care, Other (non HMO) | Source: Home / Self Care | Attending: Family Medicine | Admitting: Family Medicine

## 2015-06-03 ENCOUNTER — Encounter (HOSPITAL_COMMUNITY): Payer: Self-pay | Admitting: Emergency Medicine

## 2015-06-03 DIAGNOSIS — B86 Scabies: Secondary | ICD-10-CM

## 2015-06-03 HISTORY — DX: Encounter for supervision of normal pregnancy, unspecified, unspecified trimester: Z34.90

## 2015-06-03 MED ORDER — PERMETHRIN 5 % EX CREA: TOPICAL_CREAM | CUTANEOUS | Status: DC

## 2015-06-03 MED ORDER — HYDROXYZINE HCL 25 MG PO TABS: 25.0000 mg | ORAL_TABLET | Freq: Four times a day (QID) | ORAL | Status: DC

## 2015-06-03 NOTE — ED Notes (Signed)
Generalized itching for a week.  Patient reports boyfriend is being treated for scabies.

## 2015-06-03 NOTE — ED Provider Notes (Addendum)
CSN: [id number]     Arrival date & time 06/03/15  1533 History   First MD Initiated Contact with Patient 06/03/15 1713     Chief Complaint  Patient presents with  . Pruritis   (Consider location/radiation/quality/duration/timing/severity/associated sxs/prior Treatment) Patient is a 25 y.o. female presenting with rash. The history is provided by the patient.  Rash Location:  Full body Quality: itchiness and redness   Severity:  Moderate Onset quality:  Gradual Duration:  2 weeks Progression:  Spreading Chronicity:  New Context: sick contacts   Context comment:  Boyfriend with scabies, pt with rash and intense itching. pt is 7wk preg. Associated symptoms: no fever     Past Medical History  Diagnosis Date  . Polycystic ovary syndrome   . Depression   . Pregnant    History reviewed. No pertinent past surgical history. No family history on file. Social History  Substance Use Topics  . Smoking status: Never Smoker   . Smokeless tobacco: None  . Alcohol Use: Yes   OB History    No data available     Review of Systems  Constitutional: Negative.  Negative for fever.  HENT: Negative.   Musculoskeletal: Negative.   Skin: Positive for rash.  All other systems reviewed and are negative.   Allergies  Bee venom  Home Medications   Prior to Admission medications   Medication Sig Start Date End Date Taking? Authorizing Provider  cephALEXin (KEFLEX) 500 MG capsule Take 1 capsule (500 mg total) by mouth 4 (four) times daily. Patient not taking: Reported on 06/03/2015 12/21/14   Roxy Horseman, PA-C  escitalopram (LEXAPRO) 10 MG tablet Take 1 tablet (10 mg total) by mouth daily. 07/30/14   Adonis Brook, NP  hydrOXYzine (ATARAX/VISTARIL) 25 MG tablet Take 1 tablet (25 mg total) by mouth every 6 (six) hours. For itching. 06/03/15   Linna Hoff, MD  permethrin (ELIMITE) 5 % cream Apply over body ,not head, tonight as discussed, wash off as instructed, repeat in 1 week. See your  doctor as needed, wash all bedsheets and your clothes that have been worn. 06/03/15   Linna Hoff, MD  traZODone (DESYREL) 50 MG tablet Take 1 tablet (50 mg total) by mouth at bedtime and may repeat dose one time if needed. 07/30/14   Adonis Brook, NP   Meds Ordered and Administered this Visit  Medications - No data to display  BP 109/74 mmHg  Pulse 77  Temp(Src) 98.6 F (37 C) (Oral)  Resp 16  SpO2 100%  LMP 04/21/2015 No data found.   Physical Exam  Constitutional: She is oriented to person, place, and time. She appears well-developed and well-nourished. She appears distressed.  Neck: Normal range of motion. Neck supple.  Lymphadenopathy:    She has no cervical adenopathy.  Neurological: She is alert and oriented to person, place, and time.  Skin: Skin is warm and dry. Rash noted. There is erythema.  Numerous scattered erythematous pruritic papules, nonpustular, nonvesicular.  Nursing note and vitals reviewed.   ED Course  Procedures (including critical care time)  Labs Review Labs Reviewed - No data to display  Imaging Review No results found.   Visual Acuity Review  Right Eye Distance:   Left Eye Distance:   Bilateral Distance:    Right Eye Near:   Left Eye Near:    Bilateral Near:         MDM   1. Scabies    Meds ordered this encounter  Medications  .  permethrin (ELIMITE) 5 % cream    Sig: Apply over body ,not head, tonight as discussed, wash off as instructed, repeat in 1 week. See your doctor as needed, wash all bedsheets and your clothes that have been worn.    Dispense:  60 g    Refill:  1  . hydrOXYzine (ATARAX/VISTARIL) 25 MG tablet    Sig: Take 1 tablet (25 mg total) by mouth every 6 (six) hours. For itching.    Dispense:  30 tablet    Refill:  0        Linna Hoff, MD 06/03/15 1801  Linna Hoff, MD 06/03/15 [phone]

## 2015-06-21 LAB — OB RESULTS CONSOLE ABO/RH: RH Type: POSITIVE

## 2015-06-21 LAB — OB RESULTS CONSOLE RUBELLA ANTIBODY, IGM: RUBELLA: IMMUNE

## 2015-06-21 LAB — OB RESULTS CONSOLE RPR: RPR: NONREACTIVE

## 2015-06-21 LAB — OB RESULTS CONSOLE GC/CHLAMYDIA
CHLAMYDIA, DNA PROBE: NEGATIVE
GC PROBE AMP, GENITAL: NEGATIVE

## 2015-06-21 LAB — OB RESULTS CONSOLE HIV ANTIBODY (ROUTINE TESTING): HIV: NONREACTIVE

## 2015-06-21 LAB — OB RESULTS CONSOLE ANTIBODY SCREEN: Antibody Screen: NEGATIVE

## 2015-06-21 LAB — OB RESULTS CONSOLE HEPATITIS B SURFACE ANTIGEN: HEP B S AG: NEGATIVE

## 2015-11-14 ENCOUNTER — Encounter: Payer: Medicaid Other | Attending: Obstetrics and Gynecology | Admitting: Skilled Nursing Facility1

## 2015-11-14 DIAGNOSIS — O24410 Gestational diabetes mellitus in pregnancy, diet controlled: Secondary | ICD-10-CM

## 2015-11-14 DIAGNOSIS — Z713 Dietary counseling and surveillance: Secondary | ICD-10-CM | POA: Insufficient documentation

## 2015-11-14 DIAGNOSIS — O24419 Gestational diabetes mellitus in pregnancy, unspecified control: Secondary | ICD-10-CM | POA: Insufficient documentation

## 2015-11-16 ENCOUNTER — Encounter: Payer: Self-pay | Admitting: Skilled Nursing Facility1

## 2015-11-16 NOTE — Progress Notes (Signed)
  Patient was seen on 11/14/2015 for Gestational Diabetes self-management class at the [facility]. The following learning objectives were met by the patient during this course:   States the definition of Gestational Diabetes  States why dietary management is important in controlling blood glucose  Describes the effects each nutrient has on blood glucose levels  Demonstrates ability to create a balanced meal plan  Demonstrates carbohydrate counting   States when to check blood glucose levels involving a total of 4 separate occurences in a day  Demonstrates proper blood glucose monitoring techniques  States the effect of stress and exercise on blood glucose levels  States the importance of limiting caffeine and abstaining from alcohol and smoking  Demonstrates the knowledge the glucometer provided in class may not be covered by their insurance and to call their insurance provider immediately after class to know which glucometer their insurance provider does cover as well as calling their physician the next day for a prescription to the glucometer their insurance does cover (if the one provided is not) as well as the lancets and strips for that meter.  Blood glucose monitor given: accucheck guide Lot # [id number] Exp: 10/21/16 Blood glucose reading: 232 non fasting  Patient instructed to monitor glucose levels: FBS: 60 - <90 1 hour: <140 2 hour: <120  *Patient received handouts:  Nutrition Diabetes and Pregnancy  Carbohydrate Counting List  Patient will be seen for follow-up as needed.

## 2015-12-11 ENCOUNTER — Encounter (HOSPITAL_COMMUNITY): Payer: Self-pay

## 2015-12-11 ENCOUNTER — Inpatient Hospital Stay (HOSPITAL_COMMUNITY)
Admission: AD | Admit: 2015-12-11 | Discharge: 2015-12-11 | Disposition: A | Discharge status: Home / Self Care | Payer: Managed Care, Other (non HMO) | Source: Ambulatory Visit | Attending: Obstetrics and Gynecology | Admitting: Obstetrics and Gynecology

## 2015-12-11 DIAGNOSIS — O26893 Other specified pregnancy related conditions, third trimester: Secondary | ICD-10-CM | POA: Diagnosis not present

## 2015-12-11 DIAGNOSIS — O24419 Gestational diabetes mellitus in pregnancy, unspecified control: Secondary | ICD-10-CM | POA: Diagnosis not present

## 2015-12-11 DIAGNOSIS — R300 Dysuria: Secondary | ICD-10-CM | POA: Diagnosis not present

## 2015-12-11 DIAGNOSIS — Z3A34 34 weeks gestation of pregnancy: Secondary | ICD-10-CM | POA: Diagnosis not present

## 2015-12-11 DIAGNOSIS — O99283 Endocrine, nutritional and metabolic diseases complicating pregnancy, third trimester: Secondary | ICD-10-CM | POA: Insufficient documentation

## 2015-12-11 DIAGNOSIS — F329 Major depressive disorder, single episode, unspecified: Secondary | ICD-10-CM | POA: Diagnosis not present

## 2015-12-11 DIAGNOSIS — O9989 Other specified diseases and conditions complicating pregnancy, childbirth and the puerperium: Secondary | ICD-10-CM | POA: Insufficient documentation

## 2015-12-11 DIAGNOSIS — N390 Urinary tract infection, site not specified: Secondary | ICD-10-CM | POA: Insufficient documentation

## 2015-12-11 DIAGNOSIS — O99343 Other mental disorders complicating pregnancy, third trimester: Secondary | ICD-10-CM | POA: Insufficient documentation

## 2015-12-11 DIAGNOSIS — O2343 Unspecified infection of urinary tract in pregnancy, third trimester: Secondary | ICD-10-CM | POA: Insufficient documentation

## 2015-12-11 DIAGNOSIS — E282 Polycystic ovarian syndrome: Secondary | ICD-10-CM | POA: Insufficient documentation

## 2015-12-11 LAB — URINALYSIS, ROUTINE W REFLEX MICROSCOPIC
BILIRUBIN URINE: NEGATIVE
Glucose, UA: NEGATIVE mg/dL
HGB URINE DIPSTICK: NEGATIVE
Ketones, ur: NEGATIVE mg/dL
Leukocytes, UA: NEGATIVE
NITRITE: NEGATIVE
PROTEIN: NEGATIVE mg/dL
Specific Gravity, Urine: 1.01 (ref 1.005–1.030)
pH: 5.5 (ref 5.0–8.0)

## 2015-12-11 LAB — WET PREP, GENITAL
Clue Cells Wet Prep HPF POC: NONE SEEN
Sperm: NONE SEEN
Trich, Wet Prep: NONE SEEN
YEAST WET PREP: NONE SEEN

## 2015-12-11 NOTE — MAU Provider Note (Signed)
History     CSN: [id number]  Arrival date and time: 12/11/15 2001   First Provider Initiated Contact with Patient 12/11/15 2033      Chief Complaint  Patient presents with  . Urinary Tract Infection   Lauren Ross is a 25 y.o. G1P0 at [redacted]w[redacted]d concerned she may have a UTI. She describes onset yesterday of burning dysuria, urinary frequency and pressure over her bladder. She also describes right low back pain which she thinks is from her kidney. Pregnancy is complicated by A2 diabetes. States most recent sugar today was 90. Admit to some dietary indiscretions. Denies LOF, VB or UCs. Good FM.     Urinary Tract Infection   This is a new problem. The current episode started yesterday. The problem occurs intermittently. The problem has been waxing and waning. The quality of the pain is described as burning. The pain is at a severity of 5/10. The pain is mild. There has been no fever. Pertinent negatives include no chills, hematuria, nausea, urgency or vomiting.      Past Medical History:  Diagnosis Date  . Depression   . Polycystic ovary syndrome   . Pregnant     History reviewed. No pertinent surgical history.  History reviewed. No pertinent family history.  Social History  Substance Use Topics  . Smoking status: Never Smoker  . Smokeless tobacco: Never Used  . Alcohol use Yes    Allergies:  Allergies  Allergen Reactions  . Bee Venom Hives and Swelling    Prescriptions Prior to Admission  Medication Sig Dispense Refill Last Dose  . cephALEXin (KEFLEX) 500 MG capsule Take 1 capsule (500 mg total) by mouth 4 (four) times daily. (Patient not taking: Reported on 06/03/2015) 28 capsule 0   . escitalopram (LEXAPRO) 10 MG tablet Take 1 tablet (10 mg total) by mouth daily. 30 tablet 0 Unknown at Unknown time  . hydrOXYzine (ATARAX/VISTARIL) 25 MG tablet Take 1 tablet (25 mg total) by mouth every 6 (six) hours. For itching. 30 tablet 0   . permethrin (ELIMITE) 5 % cream Apply  over body ,not head, tonight as discussed, wash off as instructed, repeat in 1 week. See your doctor as needed, wash all bedsheets and your clothes that have been worn. 60 g 1   . traZODone (DESYREL) 50 MG tablet Take 1 tablet (50 mg total) by mouth at bedtime and may repeat dose one time if needed. 30 tablet 0 Unknown at Unknown time    Review of Systems  Constitutional: Negative for chills.  Gastrointestinal: Negative for nausea and vomiting.  Genitourinary: Negative for hematuria and urgency.   Physical Exam   Blood pressure 135/84, pulse 110, temperature 97.6 F (36.4 C), temperature source Oral, resp. rate 18.  Physical Exam  Nursing note and vitals reviewed. Constitutional: She is oriented to person, place, and time. She appears well-developed and well-nourished. No distress.  HENT:  Head: Normocephalic.  Eyes: Pupils are equal, round, and reactive to light.  Neck: Normal range of motion.  Cardiovascular: Normal rate.   Respiratory: Effort normal.  GI: Soft.  Genitourinary:  Genitourinary Comments: Wet prep sent  Musculoskeletal: Normal range of motion.  Minimal right paraspinous TTP; no CVAT  Neurological: She is alert and oriented to person, place, and time.  Skin: Skin is warm and dry.  Psychiatric: She has a normal mood and affect. Her behavior is normal.   EFM: FHR 145, moderate variability, reactive Toco: UI  MAU Course  Procedures Results for orders placed  or performed during the hospital encounter of 12/11/15 (from the past 24 hour(s))  Urinalysis, Routine w reflex microscopic (not at [facility])     Status: None   Collection Time: 12/11/15  8:10 PM  Result Value Ref Range   Color, Urine YELLOW YELLOW   APPearance CLEAR CLEAR   Specific Gravity, Urine 1.010 1.005 - 1.030   pH 5.5 5.0 - 8.0   Glucose, UA NEGATIVE NEGATIVE mg/dL   Hgb urine dipstick NEGATIVE NEGATIVE   Bilirubin Urine NEGATIVE NEGATIVE   Ketones, ur NEGATIVE NEGATIVE mg/dL   Protein, ur NEGATIVE  NEGATIVE mg/dL   Nitrite NEGATIVE NEGATIVE   Leukocytes, UA NEGATIVE NEGATIVE  j MDM C/W Dr. Mindi SlickerBanga> reassure re: negative UA and stay hydrated  Assessment and Plan  G1 at 34w)d 1. Dysuria during pregnancy in third trimester   2. Gestational diabetes mellitus, class A2      Medication List    STOP taking these medications   cephALEXin 500 MG capsule Commonly known as:  KEFLEX   hydrOXYzine 25 MG tablet Commonly known as:  ATARAX/VISTARIL   permethrin 5 % cream Commonly known as:  ELIMITE   traZODone 50 MG tablet Commonly known as:  DESYREL     TAKE these medications   escitalopram 10 MG tablet Commonly known as:  LEXAPRO Take 1 tablet (10 mg total) by mouth daily.     Continue Glyburide 5mg  po qhs and PNV 1/d  Follow-up Information    [facility], DO Follow up on 12/13/2015.   Specialty:  Obstetrics and Gynecology Contact information: [address] [place] [address] [phone]          POE,DEIRDRE 12/11/2015, 8:46 PM

## 2015-12-11 NOTE — Discharge Instructions (Signed)
Gestational Diabetes Mellitus  Gestational diabetes mellitus, often simply referred to as gestational diabetes, is a type of diabetes that some women develop during pregnancy. In gestational diabetes, the pancreas does not make enough insulin (a hormone), the cells are less responsive to the insulin that is made (insulin resistance), or both. Normally, insulin moves sugars from food into the tissue cells. The tissue cells use the sugars for energy. The lack of insulin or the lack of normal response to insulin causes excess sugars to build up in the blood instead of going into the tissue cells. As a result, high blood sugar (hyperglycemia) develops. The effect of high sugar (glucose) levels can cause many problems.   RISK FACTORS  You have an increased chance of developing gestational diabetes if you have a family history of diabetes and also have one or more of the following risk factors:  · A body mass index over 30 (obesity).  · A previous pregnancy with gestational diabetes.  · An older age at the time of pregnancy.  If blood glucose levels are kept in the normal range during pregnancy, women can have a healthy pregnancy. If your blood glucose levels are not well controlled, there may be risks to you, your unborn baby (fetus), your labor and delivery, or your newborn baby.   SYMPTOMS   If symptoms are experienced, they are much like symptoms you would normally expect during pregnancy. The symptoms of gestational diabetes include:   · Increased thirst (polydipsia).  · Increased urination (polyuria).  · Increased urination during the night (nocturia).  · Weight loss. This weight loss may be rapid.  · Frequent, recurring infections.  · Tiredness (fatigue).  · Weakness.  · Vision changes, such as blurred vision.  · Fruity smell to your breath.  · Abdominal pain.  DIAGNOSIS  Diabetes is diagnosed when blood glucose levels are increased. Your blood glucose level may be checked by one or more of the following blood  tests:  · A fasting blood glucose test. You will not be allowed to eat for at least 8 hours before a blood sample is taken.  · A random blood glucose test. Your blood glucose is checked at any time of the day regardless of when you ate.  · An oral glucose tolerance test (OGTT). Your blood glucose is measured after you have not eaten (fasted) for 1-3 hours and then after you drink a glucose-containing beverage. Since the hormones that cause insulin resistance are highest at about 24-28 weeks of a pregnancy, an OGTT is usually performed during that time. If you have risk factors, you may be screened for undiagnosed type 2 diabetes at your first prenatal visit.  TREATMENT   Gestational diabetes should be managed first with diet and exercise. Medicines may be added only if they are needed.  · You will need to take diabetes medicine or insulin daily to keep blood glucose levels in the desired range.  · You will need to match insulin dosing with exercise and healthy food choices.  If you have gestational diabetes, your treatment goal is to maintain the following blood glucose levels:  · Before meals (preprandial): at or below 95 mg/dL.  · After meals (postprandial):    One hour after a meal: at or below 140 mg/dL.    Two hours after a meal: at or below 120 mg/dL.  If you have pre-existing type 1 or type 2 diabetes, your treatment goal is to maintain the following blood glucose levels:  · Before   meals, at bedtime, and overnight: 60-99 mg/dL.  · After meals: peak of 100-129 mg/dL.  HOME CARE INSTRUCTIONS   · Have your hemoglobin A1c level checked twice a year.  · Perform daily blood glucose monitoring as directed by your health care provider. It is common to perform frequent blood glucose monitoring.  · Monitor urine ketones when you are ill and as directed by your health care provider.  · Take your diabetes medicine and insulin as directed by your health care provider to maintain your blood glucose level in the desired  range.  ¨ Never run out of diabetes medicine or insulin. It is needed every day.  ¨ Adjust insulin based on your intake of carbohydrates. Carbohydrates can raise blood glucose levels but need to be included in your diet. Carbohydrates provide vitamins, minerals, and fiber which are an essential part of a healthy diet. Carbohydrates are found in fruits, vegetables, whole grains, dairy products, legumes, and foods containing added sugars.  · Eat healthy foods. Alternate 3 meals with 3 snacks.  · Maintain a healthy weight gain. The usual total expected weight gain varies according to your prepregnancy body mass index (BMI).  · Carry a medical alert card or wear your medical alert jewelry.  · Carry a 15-gram carbohydrate snack with you at all times to treat low blood glucose (hypoglycemia). Some examples of 15-gram carbohydrate snacks include:  ¨ Glucose tablets, 3 or 4.  ¨ Glucose gel, 15-gram tube.  ¨ Raisins, 2 tablespoons (24 g).  ¨ Jelly beans, 6.  ¨ Animal crackers, 8.  ¨ Fruit juice, regular soda, or low-fat milk, 4 ounces (120 mL).  ¨ Gummy treats, 9.  · Recognize hypoglycemia. Hypoglycemia during pregnancy occurs with blood glucose levels of 60 mg/dL and below. The risk for hypoglycemia increases when fasting or skipping meals, during or after intense exercise, and during sleep. Hypoglycemia symptoms can include:  ¨ Tremors or shakes.  ¨ Decreased ability to concentrate.  ¨ Sweating.  ¨ Increased heart rate.  ¨ Headache.  ¨ Dry mouth.  ¨ Hunger.  ¨ Irritability.  ¨ Anxiety.  ¨ Restless sleep.  ¨ Altered speech or coordination.  ¨ Confusion.  · Treat hypoglycemia promptly. If you are alert and able to safely swallow, follow the 15:15 rule:  ¨ Take 15-20 grams of rapid-acting glucose or carbohydrate. Rapid-acting options include glucose gel, glucose tablets, or 4 ounces (120 mL) of fruit juice, regular soda, or low-fat milk.  ¨ Check your blood glucose level 15 minutes after taking the glucose.  ¨ Take 15-20  grams more of glucose if the repeat blood glucose level is still 70 mg/dL or below.  ¨ Eat a meal or snack within 1 hour once blood glucose levels return to normal.  · Be alert to polyuria (excess urination) and polydipsia (excess thirst) which are early signs of hyperglycemia. An early awareness of hyperglycemia allows for prompt treatment. Treat hyperglycemia as directed by your health care provider.  · Engage in at least 30 minutes of physical activity a day or as directed by your health care provider. Ten minutes of physical activity timed 30 minutes after each meal is encouraged to control postprandial blood glucose levels.  · Adjust your insulin dosing and food intake as needed if you start a new exercise or sport.  · Follow your sick-day plan at any time you are unable to eat or drink as usual.  · Avoid tobacco and alcohol use.  · Keep all follow-up visits as directed   by your health care provider.  · Follow the advice of your health care provider regarding your prenatal and post-delivery (postpartum) appointments, meal planning, exercise, medicines, vitamins, blood tests, other medical tests, and physical activities.  · Perform daily skin and foot care. Examine your skin and feet daily for cuts, bruises, redness, nail problems, bleeding, blisters, or sores.  · Brush your teeth and gums at least twice a day and floss at least once a day. Follow up with your dentist regularly.  · Schedule an eye exam during the first trimester of your pregnancy or as directed by your health care provider.  · Share your diabetes management plan with your workplace or school.  · Stay up-to-date with immunizations.  · Learn to manage stress.  · Obtain ongoing diabetes education and support as needed.  · Learn about and consider breastfeeding your baby.  · You should have your blood sugar level checked 6-12 weeks after delivery. This is done with an oral glucose tolerance test (OGTT).  SEEK MEDICAL CARE IF:   · You are unable to  eat food or drink fluids for more than 6 hours.  · You have nausea and vomiting for more than 6 hours.  · You have a blood glucose level of 200 mg/dL and you have ketones in your urine.  · There is a change in mental status.  · You develop vision problems.  · You have a persistent headache.  · You have upper abdominal pain or discomfort.  · You develop an additional serious illness.  · You have diarrhea for more than 6 hours.  · You have been sick or have had a fever for a couple of days and are not getting better.  SEEK IMMEDIATE MEDICAL CARE IF:   · You have difficulty breathing.  · You no longer feel the baby moving.  · You are bleeding or have discharge from your vagina.  · You start having premature contractions or labor.  MAKE SURE YOU:  · Understand these instructions.  · Will watch your condition.  · Will get help right away if you are not doing well or get worse.     This information is not intended to replace advice given to you by your health care provider. Make sure you discuss any questions you have with your health care provider.     Document Released: 07/09/2000 Document Revised: 04/23/2014 Document Reviewed: 10/30/2011  Elsevier Interactive Patient Education ©2016 [employer]

## 2015-12-11 NOTE — MAU Note (Signed)
Pt presents complaining of urinary frequency, pain with urination and back pain. States she has frequent UTI's and this feels the same. Called the Dr who called in a prescription but couldn't wait until tomorrow to pick it up. Denies leaking or bleeding. Reports good fetal movement.

## 2015-12-17 ENCOUNTER — Inpatient Hospital Stay (HOSPITAL_COMMUNITY)
Admission: AD | Admit: 2015-12-17 | Discharge: 2015-12-17 | Disposition: A | Discharge status: Home / Self Care | Payer: Managed Care, Other (non HMO) | Source: Ambulatory Visit | Attending: Obstetrics and Gynecology | Admitting: Obstetrics and Gynecology

## 2015-12-17 DIAGNOSIS — O26613 Liver and biliary tract disorders in pregnancy, third trimester: Secondary | ICD-10-CM | POA: Diagnosis not present

## 2015-12-17 DIAGNOSIS — O24419 Gestational diabetes mellitus in pregnancy, unspecified control: Secondary | ICD-10-CM | POA: Diagnosis present

## 2015-12-17 DIAGNOSIS — Z3A34 34 weeks gestation of pregnancy: Secondary | ICD-10-CM | POA: Diagnosis not present

## 2015-12-17 DIAGNOSIS — K831 Obstruction of bile duct: Secondary | ICD-10-CM | POA: Diagnosis not present

## 2015-12-17 MED ORDER — BETAMETHASONE SOD PHOS & ACET 6 (3-3) MG/ML IJ SUSP
12.0000 mg | Freq: Every day | INTRAMUSCULAR | Status: DC
  Administered 2015-12-17: 12 mg via INTRAMUSCULAR
  Filled 2015-12-17: qty 2

## 2015-12-17 NOTE — MAU Note (Addendum)
Here for first BMZ inj. Pt is gest diab and has cholestasis. Plan IOL next Fri so pt was told to come in this wk for BMZ shots. Denies any problems, no LOF or bleeding. Dr Ellyn HackBovard called to MAU and aware of pt. Order received for BMZ

## 2015-12-18 ENCOUNTER — Inpatient Hospital Stay (HOSPITAL_COMMUNITY)
Admission: AD | Admit: 2015-12-18 | Discharge: 2015-12-18 | Disposition: A | Discharge status: Home / Self Care | Payer: Managed Care, Other (non HMO) | Source: Ambulatory Visit | Attending: Obstetrics and Gynecology | Admitting: Obstetrics and Gynecology

## 2015-12-18 DIAGNOSIS — Z3A35 35 weeks gestation of pregnancy: Secondary | ICD-10-CM | POA: Insufficient documentation

## 2015-12-18 MED ORDER — BETAMETHASONE SOD PHOS & ACET 6 (3-3) MG/ML IJ SUSP
12.0000 mg | Freq: Once | INTRAMUSCULAR | Status: AC
  Administered 2015-12-18: 12 mg via INTRAMUSCULAR
  Filled 2015-12-18: qty 2

## 2015-12-18 NOTE — MAU Note (Signed)
Pt here for 2nd dose of BMZ. Pt is denying pain or vag bleeding. +FM.

## 2015-12-27 ENCOUNTER — Encounter (HOSPITAL_COMMUNITY): Payer: Self-pay | Admitting: *Deleted

## 2015-12-27 ENCOUNTER — Telehealth (HOSPITAL_COMMUNITY): Payer: Self-pay | Admitting: *Deleted

## 2015-12-27 NOTE — Telephone Encounter (Signed)
Preadmission screen  

## 2015-12-30 NOTE — H&P (Signed)
Lauren Ross is a 25 y.o. female presenting for scheduled medical iol. Pt is dated by a 5week 5day US. She begun complaining of itching at [redacted]weeks gestation and was subsequently noted to have cholestasis of pregnancy - as been controlled on ursodiol. She later was noted as well to have GDM - has been controlled on glyburide. She has been monitored closely during pregnancy. She received steroids for fetal lung maturity. She has a hx of anxiety/depression but has needed no meds during pregnancy ( was on lexapro/abilify prior)  OB History    Gravida Para Term Preterm AB Living   1             SAB TAB Ectopic Multiple Live Births                 Past Medical History:  Diagnosis Date  . Cholestasis of pregnancy in third trimester   . Depression   . Gestational diabetes    glyburide  . Hx of varicella   . Polycystic ovary syndrome   . Pregnant    Past Surgical History:  Procedure Laterality Date  . CHOLECYSTECTOMY    . KIDNEY STONE SURGERY    . WISDOM TOOTH EXTRACTION     Family History: family history is not on file. Social History:  reports that she has never smoked. She has never used smokeless tobacco. She reports that she drinks alcohol. Her drug history is not on file.     Maternal Diabetes: Yes:  Diabetes Type:  Insulin/Medication controlled Genetic Screening: Normal Maternal Ultrasounds/Referrals: Normal Fetal Ultrasounds or other Referrals:  None Maternal Substance Abuse:  No Significant Maternal Medications:  Meds include: Other: glyburide, urspdiol Significant Maternal Lab Results:  Lab values include: Group B Strep positive Other Comments:  None  Review of Systems  Constitutional: Positive for malaise/fatigue. Negative for chills, fever and weight loss.  Eyes: Negative for blurred vision and double vision.  Respiratory: Negative for shortness of breath.   Cardiovascular: Negative for chest pain.  Gastrointestinal: Negative for abdominal pain, heartburn, nausea and  vomiting.  Genitourinary: Negative for dysuria.  Musculoskeletal: Positive for back pain. Negative for myalgias and neck pain.  Skin: Negative for itching and rash.  Neurological: Negative for dizziness and headaches.  Endo/Heme/Allergies: Does not bruise/bleed easily.  Psychiatric/Behavioral: Negative for depression, hallucinations, substance abuse and suicidal ideas. The patient is nervous/anxious.    Maternal Medical History:  Reason for admission: Nausea. iol  Fetal activity: Perceived fetal activity is normal.   Last perceived fetal movement was within the past hour.    Prenatal complications: Cholelithiasis.   Prenatal Complications - Diabetes: gestational. Diabetes is managed by oral agent (monotherapy).        There were no vitals taken for this visit. Maternal Exam:  Uterine Assessment: Contraction strength is mild.  Contraction frequency is rare.   Abdomen: Patient reports generalized tenderness.  Fundal height is 40.   Estimated fetal weight is 7-8lbs.   Fetal presentation: vertex  Introitus: Normal vulva. Normal vagina.  Pelvis: adequate for delivery.   Cervix: Cervix evaluated by digital exam.     Physical Exam  Constitutional: She is oriented to person, place, and time. She appears well-developed and well-nourished.  HENT:  Head: Normocephalic.  Neck: Normal range of motion.  Cardiovascular: Normal rate.   Respiratory: Effort normal.  GI: Soft. There is generalized tenderness.  Genitourinary: Vagina normal and uterus normal.  Musculoskeletal: Normal range of motion.  Neurological: She is alert and oriented to person, place,  and time.  Skin: Skin is warm.  Psychiatric: She has a normal mood and affect. Her behavior is normal. Judgment and thought content normal.    Prenatal labs: ABO, Rh: O/Positive/-- (03/07 0000) Antibody: Negative (03/07 0000) Rubella: Immune (03/07 0000) RPR: Nonreactive (03/07 0000)  HBsAg: Negative (03/07 0000)  HIV:  Non-reactive (03/07 0000)  GBS:     Assessment/Plan: G1P0 presenting at 37 1/[redacted]wks ga for IOL due to cholestasis of pregnancy and GDMA2 Will place cytotec for ripening AROM when able/pitocin Pain control prn GBS treatment in active labor with PCN Anticipate svd   Sharol Given Spiro Ausborn 12/30/2015, 4:50 PM

## 2015-12-30 NOTE — H&P (Deleted)
  The note originally documented on this encounter has been moved the the encounter in which it belongs.  

## 2016-01-01 ENCOUNTER — Inpatient Hospital Stay (HOSPITAL_COMMUNITY)
Admission: RE | Admit: 2016-01-01 | Discharge: 2016-01-01 | Disposition: A | Discharge status: Home / Self Care | Payer: Managed Care, Other (non HMO) | Source: Ambulatory Visit | Attending: Obstetrics and Gynecology | Admitting: Obstetrics and Gynecology

## 2016-01-02 ENCOUNTER — Inpatient Hospital Stay (HOSPITAL_COMMUNITY)
Admission: AD | Admit: 2016-01-02 | Discharge: 2016-01-06 | DRG: 765 | Disposition: A | Discharge status: Home / Self Care | Payer: Managed Care, Other (non HMO) | Source: Ambulatory Visit | Attending: Obstetrics and Gynecology | Admitting: Obstetrics and Gynecology

## 2016-01-02 ENCOUNTER — Encounter (HOSPITAL_COMMUNITY): Payer: Self-pay | Admitting: Anesthesiology

## 2016-01-02 ENCOUNTER — Encounter (HOSPITAL_COMMUNITY): Payer: Self-pay | Admitting: Certified Nurse Midwife

## 2016-01-02 ENCOUNTER — Inpatient Hospital Stay (HOSPITAL_COMMUNITY): Payer: Managed Care, Other (non HMO) | Admitting: Anesthesiology

## 2016-01-02 DIAGNOSIS — O324XX0 Maternal care for high head at term, not applicable or unspecified: Secondary | ICD-10-CM | POA: Diagnosis present

## 2016-01-02 DIAGNOSIS — O2662 Liver and biliary tract disorders in childbirth: Secondary | ICD-10-CM | POA: Diagnosis present

## 2016-01-02 DIAGNOSIS — Z3A37 37 weeks gestation of pregnancy: Secondary | ICD-10-CM | POA: Diagnosis not present

## 2016-01-02 DIAGNOSIS — Z3490 Encounter for supervision of normal pregnancy, unspecified, unspecified trimester: Secondary | ICD-10-CM

## 2016-01-02 DIAGNOSIS — O99824 Streptococcus B carrier state complicating childbirth: Secondary | ICD-10-CM | POA: Diagnosis present

## 2016-01-02 DIAGNOSIS — O24425 Gestational diabetes mellitus in childbirth, controlled by oral hypoglycemic drugs: Secondary | ICD-10-CM | POA: Diagnosis present

## 2016-01-02 DIAGNOSIS — O99214 Obesity complicating childbirth: Secondary | ICD-10-CM | POA: Diagnosis present

## 2016-01-02 DIAGNOSIS — Z6841 Body Mass Index (BMI) 40.0 and over, adult: Secondary | ICD-10-CM

## 2016-01-02 DIAGNOSIS — K831 Obstruction of bile duct: Secondary | ICD-10-CM | POA: Diagnosis present

## 2016-01-02 LAB — CBC
HCT: 39 % (ref 36.0–46.0)
HEMOGLOBIN: 13.4 g/dL (ref 12.0–15.0)
MCH: 30.2 pg (ref 26.0–34.0)
MCHC: 34.4 g/dL (ref 30.0–36.0)
MCV: 87.8 fL (ref 78.0–100.0)
PLATELETS: 271 10*3/uL (ref 150–400)
RBC: 4.44 MIL/uL (ref 3.87–5.11)
RDW: 14.1 % (ref 11.5–15.5)
WBC: 14.8 10*3/uL — AB (ref 4.0–10.5)

## 2016-01-02 LAB — TYPE AND SCREEN
ABO/RH(D): O POS
ANTIBODY SCREEN: NEGATIVE

## 2016-01-02 LAB — COMPREHENSIVE METABOLIC PANEL
ALBUMIN: 2.6 g/dL — AB (ref 3.5–5.0)
ALK PHOS: 134 U/L — AB (ref 38–126)
ALT: 14 U/L (ref 14–54)
AST: 16 U/L (ref 15–41)
Anion gap: 9 (ref 5–15)
BUN: 11 mg/dL (ref 6–20)
CALCIUM: 9 mg/dL (ref 8.9–10.3)
CHLORIDE: 109 mmol/L (ref 101–111)
CO2: 19 mmol/L — AB (ref 22–32)
CREATININE: 0.63 mg/dL (ref 0.44–1.00)
GFR calc Af Amer: >60 mL/min (ref >60)
GFR calc non Af Amer: >60 mL/min (ref >60)
GLUCOSE: 66 mg/dL (ref 65–99)
Potassium: 3.8 mmol/L (ref 3.5–5.1)
SODIUM: 137 mmol/L (ref 135–145)
Total Bilirubin: 0.5 mg/dL (ref 0.3–1.2)
Total Protein: 6.3 g/dL — ABNORMAL LOW (ref 6.5–8.1)

## 2016-01-02 LAB — LACTATE DEHYDROGENASE: LDH: 170 U/L (ref 98–192)

## 2016-01-02 LAB — PROTEIN / CREATININE RATIO, URINE
CREATININE, URINE: 72 mg/dL
Protein Creatinine Ratio: 0.32 mg/mg{Cre} — ABNORMAL HIGH (ref 0.00–0.15)
Total Protein, Urine: 23 mg/dL

## 2016-01-02 LAB — URIC ACID: URIC ACID, SERUM: 4.6 mg/dL (ref 2.3–6.6)

## 2016-01-02 LAB — RPR: RPR: NONREACTIVE

## 2016-01-02 LAB — ABO/RH: ABO/RH(D): O POS

## 2016-01-02 MED ORDER — LACTATED RINGERS IV SOLN
500.0000 mL | INTRAVENOUS | Status: DC | PRN
  Administered 2016-01-02: 500 mL via INTRAVENOUS

## 2016-01-02 MED ORDER — DIPHENHYDRAMINE HCL 50 MG/ML IJ SOLN: 12.5000 mg | INTRAMUSCULAR | Status: DC | PRN

## 2016-01-02 MED ORDER — PHENYLEPHRINE 40 MCG/ML (10ML) SYRINGE FOR IV PUSH (FOR BLOOD PRESSURE SUPPORT): 80.0000 ug | PREFILLED_SYRINGE | INTRAVENOUS | Status: DC | PRN

## 2016-01-02 MED ORDER — TERBUTALINE SULFATE 1 MG/ML IJ SOLN: 0.2500 mg | Freq: Once | INTRAMUSCULAR | Status: DC | PRN

## 2016-01-02 MED ORDER — FENTANYL 2.5 MCG/ML BUPIVACAINE 1/10 % EPIDURAL INFUSION (WH - ANES)
14.0000 mL/h | INTRAMUSCULAR | Status: DC | PRN
  Administered 2016-01-02: 14 mL/h via EPIDURAL
  Filled 2016-01-02 (×2): qty 125

## 2016-01-02 MED ORDER — LIDOCAINE HCL (PF) 1 % IJ SOLN: 30.0000 mL | INTRAMUSCULAR | Status: DC | PRN

## 2016-01-02 MED ORDER — OXYTOCIN 40 UNITS IN LACTATED RINGERS INFUSION - SIMPLE MED
2.5000 [IU]/h | INTRAVENOUS | Status: DC
  Filled 2016-01-02: qty 1000

## 2016-01-02 MED ORDER — OXYTOCIN BOLUS FROM INFUSION: 500.0000 mL | Freq: Once | INTRAVENOUS | Status: DC

## 2016-01-02 MED ORDER — LIDOCAINE HCL (PF) 1 % IJ SOLN
INTRAMUSCULAR | Status: DC | PRN
  Administered 2016-01-02 (×2): 4 mL

## 2016-01-02 MED ORDER — LACTATED RINGERS IV SOLN
500.0000 mL | Freq: Once | INTRAVENOUS | Status: AC
  Administered 2016-01-02: 500 mL via INTRAVENOUS

## 2016-01-02 MED ORDER — ACETAMINOPHEN 325 MG PO TABS: 650.0000 mg | ORAL_TABLET | ORAL | Status: DC | PRN

## 2016-01-02 MED ORDER — LACTATED RINGERS IV SOLN
INTRAVENOUS | Status: DC
  Administered 2016-01-02 – 2016-01-03 (×3): via INTRAVENOUS

## 2016-01-02 MED ORDER — PENICILLIN G POTASSIUM 5000000 UNITS IJ SOLR
2.5000 10*6.[IU] | INTRAVENOUS | Status: DC
  Filled 2016-01-02 (×3): qty 2.5

## 2016-01-02 MED ORDER — DEXTROSE 5 % IV SOLN
5.0000 10*6.[IU] | Freq: Once | INTRAVENOUS | Status: DC
  Filled 2016-01-02: qty 5

## 2016-01-02 MED ORDER — ONDANSETRON HCL 4 MG/2ML IJ SOLN: 4.0000 mg | Freq: Four times a day (QID) | INTRAMUSCULAR | Status: DC | PRN

## 2016-01-02 MED ORDER — EPHEDRINE 5 MG/ML INJ: 10.0000 mg | INTRAVENOUS | Status: DC | PRN

## 2016-01-02 MED ORDER — PENICILLIN G POTASSIUM 5000000 UNITS IJ SOLR
5.0000 10*6.[IU] | Freq: Once | INTRAVENOUS | Status: AC
  Administered 2016-01-02: 5 10*6.[IU] via INTRAVENOUS
  Filled 2016-01-02: qty 5

## 2016-01-02 MED ORDER — OXYTOCIN 40 UNITS IN LACTATED RINGERS INFUSION - SIMPLE MED
1.0000 m[IU]/min | INTRAVENOUS | Status: DC
  Administered 2016-01-02: 2 m[IU]/min via INTRAVENOUS

## 2016-01-02 MED ORDER — BUTORPHANOL TARTRATE 1 MG/ML IJ SOLN
1.0000 mg | INTRAMUSCULAR | Status: DC | PRN
  Administered 2016-01-02 (×2): 1 mg via INTRAVENOUS
  Filled 2016-01-02 (×2): qty 1

## 2016-01-02 MED ORDER — MISOPROSTOL 25 MCG QUARTER TABLET
25.0000 ug | ORAL_TABLET | ORAL | Status: DC | PRN
  Administered 2016-01-02 (×3): 25 ug via VAGINAL
  Filled 2016-01-02 (×3): qty 0.25

## 2016-01-02 MED ORDER — SOD CITRATE-CITRIC ACID 500-334 MG/5ML PO SOLN
30.0000 mL | ORAL | Status: DC | PRN
  Administered 2016-01-03: 30 mL via ORAL
  Filled 2016-01-02: qty 15

## 2016-01-02 MED ORDER — OXYCODONE-ACETAMINOPHEN 5-325 MG PO TABS: 2.0000 | ORAL_TABLET | ORAL | Status: DC | PRN

## 2016-01-02 MED ORDER — PENICILLIN G POTASSIUM 5000000 UNITS IJ SOLR
2.5000 10*6.[IU] | INTRAVENOUS | Status: DC
  Administered 2016-01-02 – 2016-01-03 (×2): 2.5 10*6.[IU] via INTRAVENOUS
  Filled 2016-01-02 (×5): qty 2.5

## 2016-01-02 MED ORDER — OXYCODONE-ACETAMINOPHEN 5-325 MG PO TABS: 1.0000 | ORAL_TABLET | ORAL | Status: DC | PRN

## 2016-01-02 MED ORDER — OXYTOCIN 40 UNITS IN LACTATED RINGERS INFUSION - SIMPLE MED: 1.0000 m[IU]/min | INTRAVENOUS | Status: DC

## 2016-01-02 NOTE — Anesthesia Procedure Notes (Signed)
Epidural Patient location during procedure: OB  Staffing Anesthesiologist: Kreston Ahrendt Performed: anesthesiologist   Preanesthetic Checklist Completed: patient identified, site marked, surgical consent, pre-op evaluation, timeout performed, IV checked, risks and benefits discussed and monitors and equipment checked  Epidural Patient position: sitting Prep: site prepped and draped and DuraPrep Patient monitoring: continuous pulse ox and blood pressure Approach: midline Location: L3-L4 Injection technique: LOR saline  Needle:  Needle type: Tuohy  Needle gauge: 17 G Needle length: 9 cm and 9 Needle insertion depth: 7 cm Catheter type: closed end flexible Catheter size: 19 Gauge Catheter at skin depth: 11 cm Test dose: negative  Assessment Events: blood not aspirated, injection not painful, no injection resistance, negative IV test and no paresthesia  Additional Notes Patient identified. Risks/Benefits/Options discussed with patient including but not limited to bleeding, infection, nerve damage, paralysis, failed block, incomplete pain control, headache, blood pressure changes, nausea, vomiting, reactions to medication both or allergic, itching and postpartum back pain. Confirmed with bedside nurse the patient's most recent platelet count. Confirmed with patient that they are not currently taking any anticoagulation, have any bleeding history or any family history of bleeding disorders. Patient expressed understanding and wished to proceed. All questions were answered. Sterile technique was used throughout the entire procedure. Please see nursing notes for vital signs. Test dose was given through epidural catheter and negative prior to continuing to dose epidural or start infusion. Warning signs of high block given to the patient including shortness of breath, tingling/numbness in hands, complete motor block, or any concerning symptoms with instructions to call for help. Patient was given  instructions on fall risk and not to get out of bed. All questions and concerns addressed with instructions to call with any issues or inadequate analgesia.        

## 2016-01-02 NOTE — Anesthesia Pain Management Evaluation Note (Signed)
  CRNA Pain Management Visit Note  Patient: Lauren Ross, 25 y.o., female  "Hello I am a member of the anesthesia team at [facility]. We have an anesthesia team available at all times to provide care throughout the hospital, including epidural management and anesthesia for C-section. I don't know your plan for the delivery whether it a natural birth, water birth, IV sedation, nitrous supplementation, doula or epidural, but we want to meet your pain goals."   1.Was your pain managed to your expectations on prior hospitalizations?   No prior hospitalizations  2.What is your expectation for pain management during this hospitalization?     Epidural  3.How can we help you reach that goal? Epidural when labor is established.  Record the patient's initial score and the patient's pain goal.   Pain: 0  Pain Goal: 5 [facility] wants you to be able to say your pain was always managed very well.  Ka Bench 01/02/2016

## 2016-01-02 NOTE — Progress Notes (Signed)
Lauren Ross is a 25 y.o. G1P0 at 5274w1d by ultrasound admitted for induction of labor due to Gestational diabetes and cholestasis .  Subjective:   Objective: BP 134/84 (BP Location: Left Arm)   Pulse 92   Temp 98.2 F (36.8 C) (Oral)   Resp 18   Ht 5\' 2"  (1.575 m)   Wt 232 lb (105.2 kg)   LMP  (Exact Date)   BMI 42.43 kg/m  No intake/output data recorded. Total I/O In: -  Out: 1 [Stool:1]  FHT:  FHR: 145 bpm, variability: moderate,  accelerations:  Present,  decelerations:  Absent UC:   irregular, every 4 minutes SVE:   Dilation: Closed Effacement (%): Thick Station: -3 Exam by:: Len ChildsK Lashley RN   Labs: Lab Results  Component Value Date   WBC 14.8 (H) 01/02/2016   HGB 13.4 01/02/2016   HCT 39.0 01/02/2016   MCV 87.8 01/02/2016   PLT 271 01/02/2016    Assessment / Plan: Induction of labor due to gestational diabetes and GDM,  progressing well on pitocin  Labor: on third dose of cytotec; recheck in 4 hours Preeclampsia:  n/a Fetal Wellbeing:  Category I Pain Control:  Labor support without medications I/D:  n/a Anticipated MOD:  NSVD  Lauren Ross 01/02/2016, 10:10 AM

## 2016-01-02 NOTE — Progress Notes (Signed)
Patient ID: Lauren Ross, female   DOB: 10-17-1990, 25 y.o.   MRN: [id number] Pt reports painful contractions Bloody show noted after foley bulb easily removed VSS CAT 1 strip SVE 5.5/80/-2; bulging bag  A/P: IUP at 37 1/7wks with cholestasis and GDMA2         Will start pcn for gbs          Pt to receive epidural          Recheck and AROM in 2-3hours         Continue pitocin at 2mus; increase per protocol if no change at recheck         Anticipate svd

## 2016-01-02 NOTE — Progress Notes (Addendum)
Patient ID: Lauren Ross, female   DOB: 1990/11/30, 25 y.o.   MRN: [id number] Pt beginning to appreciate come "cramps"  Cervix 0.5cm/80/-1 Cat 1 strip  Cooks catheter placed with 60u/40v. Pt tolerated well Low dose pitocin to be started  Will recheck in 2 hours GBS prophylaxis in active labor Plan on svd

## 2016-01-02 NOTE — Progress Notes (Signed)
Patient ID: Lauren SimmerChera Petti, female   DOB: 06-01-1990, 25 y.o.   MRN: [id number] Intermittent variables noted  IUPC and FSE placed Copious clear amniotic fluid noted  Plan as before

## 2016-01-02 NOTE — Progress Notes (Signed)
Patient ID: Lauren SimmerChera Coy, female   DOB: [birth year], 25 y.o.   MRN: [id number] Pt reports feels shaky FSBS 119 VSS 140s/80-90s; denies headaches  AROM performed with moderate amount of clear amniotic fluid noted CAT 1 strip SVE 5.5/80/-2  A/P: Will allow labor meal ( light)         Will run pitocin per protocol          Recheck in 3 hours or prn         Anticipate svd

## 2016-01-02 NOTE — Progress Notes (Signed)
Patient pulling off EFM stating "straps are too uncomfortable"; Dr Mindi SlickerBanga called and new orders given for intermittent monitoring with cytotec administration

## 2016-01-02 NOTE — Anesthesia Preprocedure Evaluation (Signed)
Anesthesia Evaluation  Patient identified by MRN, date of birth, ID band Patient awake    Reviewed: Allergy & Precautions, NPO status , Patient's Chart, lab work & pertinent test results  History of Anesthesia Complications Negative for: history of anesthetic complications  Airway Mallampati: II  TM Distance: >3 FB Neck ROM: Full    Dental no notable dental hx. (+) Dental Advisory Given   Pulmonary neg pulmonary ROS,    Pulmonary exam normal breath sounds clear to auscultation       Cardiovascular negative cardio ROS Normal cardiovascular exam Rhythm:Regular Rate:Normal     Neuro/Psych negative neurological ROS  negative psych ROS   GI/Hepatic negative GI ROS, Neg liver ROS,   Endo/Other  diabetes, GestationalMorbid obesity  Renal/GU negative Renal ROS  negative genitourinary   Musculoskeletal negative musculoskeletal ROS (+)   Abdominal   Peds negative pediatric ROS (+)  Hematology negative hematology ROS (+)   Anesthesia Other Findings   Reproductive/Obstetrics negative OB ROS                             Anesthesia Physical Anesthesia Plan  ASA: III  Anesthesia Plan: Epidural   Post-op Pain Management:    Induction:   Airway Management Planned:   Additional Equipment:   Intra-op Plan:   Post-operative Plan:   Informed Consent: I have reviewed the patients History and Physical, chart, labs and discussed the procedure including the risks, benefits and alternatives for the proposed anesthesia with the patient or authorized representative who has indicated his/her understanding and acceptance.   Dental advisory given  Plan Discussed with: CRNA  Anesthesia Plan Comments:         Anesthesia Quick Evaluation

## 2016-01-02 NOTE — Progress Notes (Addendum)
0800: assumed care of pt after receiving report from night shift nurse.   Transducer applied for minute for FHR status. See flow sheet for details  1255: Checked on pt. Resting quietly. Family at bs.   1517: Assisted pt up to bathroom. Voided without problems.   1521: back in bed. Repositioned to right side. Tugged on Fb. Resistance felt. Pt tolerated it well.   1715: back in bed from voiding. Monitors connected.  1724: FB pulled. Came out.easily. Will start PCN per MD order.  1730: PCN ordered via CC epic.   1754: IV bolus infusion noted.  1750: instructed pt to remove all jewelry.  1759: pt still removing jewelry.   1802: Pt breathing thru current ctx  1810: Positioned for epidural. Notified aneshesia and aware pt desires epidural.   1816: Anesthesia at bs, Dr. Gentry RochJudd  1819: T/O done  1822: Test dose, pulse ox placed with BP cycled q5 mins. Instructed pt not to get oob due to epidural placement. Pt verbalizes understanding. Instruction of PCA epidural discussed with pt understanding of them as well.   1845: Foley placed. Noted light yellow clear urine in tubing.

## 2016-01-03 ENCOUNTER — Encounter (HOSPITAL_COMMUNITY)
Admission: AD | Disposition: A | Discharge status: Home / Self Care | Payer: Self-pay | Source: Ambulatory Visit | Attending: Obstetrics and Gynecology

## 2016-01-03 ENCOUNTER — Encounter (HOSPITAL_COMMUNITY): Payer: Self-pay

## 2016-01-03 LAB — GLUCOSE, CAPILLARY
GLUCOSE-CAPILLARY: 119 mg/dL — AB (ref 65–99)
GLUCOSE-CAPILLARY: 140 mg/dL — AB (ref 65–99)

## 2016-01-03 SURGERY — Surgical Case
Anesthesia: Epidural

## 2016-01-03 MED ORDER — OXYTOCIN 10 UNIT/ML IJ SOLN
INTRAMUSCULAR | Status: AC
  Filled 2016-01-03: qty 4

## 2016-01-03 MED ORDER — WITCH HAZEL-GLYCERIN EX PADS: 1.0000 "application " | MEDICATED_PAD | CUTANEOUS | Status: DC | PRN

## 2016-01-03 MED ORDER — NALBUPHINE HCL 10 MG/ML IJ SOLN: 5.0000 mg | INTRAMUSCULAR | Status: DC | PRN

## 2016-01-03 MED ORDER — DIPHENHYDRAMINE HCL 25 MG PO CAPS: 25.0000 mg | ORAL_CAPSULE | Freq: Four times a day (QID) | ORAL | Status: DC | PRN

## 2016-01-03 MED ORDER — TETANUS-DIPHTH-ACELL PERTUSSIS 5-2.5-18.5 LF-MCG/0.5 IM SUSP: 0.5000 mL | Freq: Once | INTRAMUSCULAR | Status: DC

## 2016-01-03 MED ORDER — SCOPOLAMINE 1 MG/3DAYS TD PT72
1.0000 | MEDICATED_PATCH | Freq: Once | TRANSDERMAL | Status: DC
  Filled 2016-01-03: qty 1

## 2016-01-03 MED ORDER — ONDANSETRON HCL 4 MG/2ML IJ SOLN: 4.0000 mg | Freq: Three times a day (TID) | INTRAMUSCULAR | Status: DC | PRN

## 2016-01-03 MED ORDER — OXYTOCIN 40 UNITS IN LACTATED RINGERS INFUSION - SIMPLE MED: 1.0000 m[IU]/min | INTRAVENOUS | Status: DC

## 2016-01-03 MED ORDER — KETOROLAC TROMETHAMINE 30 MG/ML IJ SOLN: 30.0000 mg | Freq: Four times a day (QID) | INTRAMUSCULAR | Status: AC | PRN

## 2016-01-03 MED ORDER — LIDOCAINE-EPINEPHRINE (PF) 2 %-1:200000 IJ SOLN
INTRAMUSCULAR | Status: DC | PRN
  Administered 2016-01-03 (×2): 5 mL via EPIDURAL
  Administered 2016-01-03: 3 mL via EPIDURAL
  Administered 2016-01-03: 2 mL via EPIDURAL
  Administered 2016-01-03: 5 mL via EPIDURAL

## 2016-01-03 MED ORDER — IBUPROFEN 600 MG PO TABS
600.0000 mg | ORAL_TABLET | Freq: Four times a day (QID) | ORAL | Status: DC
  Administered 2016-01-03 – 2016-01-06 (×12): 600 mg via ORAL
  Filled 2016-01-03 (×12): qty 1

## 2016-01-03 MED ORDER — ONDANSETRON HCL 4 MG/2ML IJ SOLN
INTRAMUSCULAR | Status: AC
  Filled 2016-01-03: qty 2

## 2016-01-03 MED ORDER — MORPHINE SULFATE (PF) 0.5 MG/ML IJ SOLN
INTRAMUSCULAR | Status: AC
  Filled 2016-01-03: qty 10

## 2016-01-03 MED ORDER — OXYCODONE-ACETAMINOPHEN 5-325 MG PO TABS
2.0000 | ORAL_TABLET | ORAL | Status: DC | PRN
  Administered 2016-01-05 – 2016-01-06 (×8): 2 via ORAL
  Filled 2016-01-03 (×8): qty 2

## 2016-01-03 MED ORDER — SIMETHICONE 80 MG PO CHEW
80.0000 mg | CHEWABLE_TABLET | Freq: Three times a day (TID) | ORAL | Status: DC
  Administered 2016-01-03 – 2016-01-06 (×9): 80 mg via ORAL
  Filled 2016-01-03 (×6): qty 1

## 2016-01-03 MED ORDER — SIMETHICONE 80 MG PO CHEW: 80.0000 mg | CHEWABLE_TABLET | ORAL | Status: DC | PRN

## 2016-01-03 MED ORDER — SCOPOLAMINE 1 MG/3DAYS TD PT72
MEDICATED_PATCH | TRANSDERMAL | Status: AC
  Filled 2016-01-03: qty 1

## 2016-01-03 MED ORDER — OXYTOCIN 10 UNIT/ML IJ SOLN
INTRAVENOUS | Status: DC | PRN
  Administered 2016-01-03: 40 [IU] via INTRAVENOUS

## 2016-01-03 MED ORDER — SODIUM CHLORIDE 0.9% FLUSH: 3.0000 mL | INTRAVENOUS | Status: DC | PRN

## 2016-01-03 MED ORDER — DEXAMETHASONE SODIUM PHOSPHATE 4 MG/ML IJ SOLN
INTRAMUSCULAR | Status: DC | PRN
  Administered 2016-01-03: 4 mg via INTRAVENOUS

## 2016-01-03 MED ORDER — DIPHENHYDRAMINE HCL 50 MG/ML IJ SOLN: 12.5000 mg | INTRAMUSCULAR | Status: DC | PRN

## 2016-01-03 MED ORDER — NALOXONE HCL 0.4 MG/ML IJ SOLN: 0.4000 mg | INTRAMUSCULAR | Status: DC | PRN

## 2016-01-03 MED ORDER — FENTANYL CITRATE (PF) 100 MCG/2ML IJ SOLN
INTRAMUSCULAR | Status: AC
  Filled 2016-01-03: qty 2

## 2016-01-03 MED ORDER — MIDAZOLAM HCL 2 MG/2ML IJ SOLN
INTRAMUSCULAR | Status: AC
  Filled 2016-01-03: qty 2

## 2016-01-03 MED ORDER — MENTHOL 3 MG MT LOZG: 1.0000 | LOZENGE | OROMUCOSAL | Status: DC | PRN

## 2016-01-03 MED ORDER — ONDANSETRON HCL 4 MG/2ML IJ SOLN: 4.0000 mg | Freq: Once | INTRAMUSCULAR | Status: DC | PRN

## 2016-01-03 MED ORDER — KETOROLAC TROMETHAMINE 30 MG/ML IJ SOLN
INTRAMUSCULAR | Status: AC
  Administered 2016-01-03: 30 mg
  Filled 2016-01-03: qty 1

## 2016-01-03 MED ORDER — FENTANYL CITRATE (PF) 100 MCG/2ML IJ SOLN
INTRAMUSCULAR | Status: DC | PRN
  Administered 2016-01-03 (×4): 25 ug via INTRAVENOUS

## 2016-01-03 MED ORDER — KETAMINE HCL 10 MG/ML IJ SOLN
INTRAMUSCULAR | Status: AC
  Filled 2016-01-03: qty 1

## 2016-01-03 MED ORDER — NALOXONE HCL 2 MG/2ML IJ SOSY
1.0000 ug/kg/h | PREFILLED_SYRINGE | INTRAMUSCULAR | Status: DC | PRN
  Filled 2016-01-03: qty 2

## 2016-01-03 MED ORDER — KETOROLAC TROMETHAMINE 30 MG/ML IJ SOLN
30.0000 mg | Freq: Four times a day (QID) | INTRAMUSCULAR | Status: AC | PRN
  Administered 2016-01-03: 30 mg via INTRAVENOUS
  Filled 2016-01-03: qty 1

## 2016-01-03 MED ORDER — LACTATED RINGERS IV SOLN
INTRAVENOUS | Status: DC
  Administered 2016-01-03: 125 mL/h via INTRAVENOUS
  Administered 2016-01-03: 10:00:00 via INTRAVENOUS

## 2016-01-03 MED ORDER — MEPERIDINE HCL 25 MG/ML IJ SOLN
INTRAMUSCULAR | Status: AC
  Filled 2016-01-03: qty 1

## 2016-01-03 MED ORDER — SENNOSIDES-DOCUSATE SODIUM 8.6-50 MG PO TABS
2.0000 | ORAL_TABLET | ORAL | Status: DC
  Administered 2016-01-03 – 2016-01-05 (×3): 2 via ORAL
  Filled 2016-01-03 (×3): qty 2

## 2016-01-03 MED ORDER — NALBUPHINE HCL 10 MG/ML IJ SOLN: 5.0000 mg | Freq: Once | INTRAMUSCULAR | Status: DC | PRN

## 2016-01-03 MED ORDER — LIDOCAINE-EPINEPHRINE (PF) 2 %-1:200000 IJ SOLN
INTRAMUSCULAR | Status: AC
  Filled 2016-01-03: qty 20

## 2016-01-03 MED ORDER — DEXAMETHASONE SODIUM PHOSPHATE 4 MG/ML IJ SOLN
INTRAMUSCULAR | Status: AC
  Filled 2016-01-03: qty 1

## 2016-01-03 MED ORDER — LACTATED RINGERS IV SOLN: INTRAVENOUS | Status: DC | PRN

## 2016-01-03 MED ORDER — ACETAMINOPHEN 500 MG PO TABS
1000.0000 mg | ORAL_TABLET | Freq: Four times a day (QID) | ORAL | Status: AC
  Administered 2016-01-03 (×2): 1000 mg via ORAL
  Filled 2016-01-03 (×2): qty 2

## 2016-01-03 MED ORDER — MEPERIDINE HCL 25 MG/ML IJ SOLN: 6.2500 mg | INTRAMUSCULAR | Status: DC | PRN

## 2016-01-03 MED ORDER — CEFAZOLIN SODIUM-DEXTROSE 2-4 GM/100ML-% IV SOLN
INTRAVENOUS | Status: AC
  Filled 2016-01-03: qty 100

## 2016-01-03 MED ORDER — ACETAMINOPHEN 325 MG PO TABS
650.0000 mg | ORAL_TABLET | ORAL | Status: DC | PRN
Start: 2016-01-03 — End: 2016-01-06

## 2016-01-03 MED ORDER — FENTANYL CITRATE (PF) 100 MCG/2ML IJ SOLN: 25.0000 ug | INTRAMUSCULAR | Status: DC | PRN

## 2016-01-03 MED ORDER — DIPHENHYDRAMINE HCL 25 MG PO CAPS: 25.0000 mg | ORAL_CAPSULE | ORAL | Status: DC | PRN

## 2016-01-03 MED ORDER — SODIUM BICARBONATE 8.4 % IV SOLN
INTRAVENOUS | Status: DC | PRN
  Administered 2016-01-03: 20 mL via EPIDURAL

## 2016-01-03 MED ORDER — SIMETHICONE 80 MG PO CHEW
80.0000 mg | CHEWABLE_TABLET | ORAL | Status: DC
  Administered 2016-01-03 – 2016-01-05 (×3): 80 mg via ORAL
  Filled 2016-01-03 (×3): qty 1

## 2016-01-03 MED ORDER — PHENYLEPHRINE 40 MCG/ML (10ML) SYRINGE FOR IV PUSH (FOR BLOOD PRESSURE SUPPORT)
PREFILLED_SYRINGE | INTRAVENOUS | Status: AC
  Filled 2016-01-03: qty 10

## 2016-01-03 MED ORDER — ZOLPIDEM TARTRATE 5 MG PO TABS: 5.0000 mg | ORAL_TABLET | Freq: Every evening | ORAL | Status: DC | PRN

## 2016-01-03 MED ORDER — MAGNESIUM HYDROXIDE 400 MG/5ML PO SUSP: 30.0000 mL | ORAL | Status: DC | PRN

## 2016-01-03 MED ORDER — DIBUCAINE 1 % RE OINT: 1.0000 "application " | TOPICAL_OINTMENT | RECTAL | Status: DC | PRN

## 2016-01-03 MED ORDER — DEXTROSE 5 % IV SOLN
INTRAVENOUS | Status: DC | PRN
  Administered 2016-01-03: 3 g via INTRAVENOUS

## 2016-01-03 MED ORDER — OXYTOCIN 40 UNITS IN LACTATED RINGERS INFUSION - SIMPLE MED: 2.5000 [IU]/h | INTRAVENOUS | Status: AC

## 2016-01-03 MED ORDER — COCONUT OIL OIL
1.0000 "application " | TOPICAL_OIL | Status: DC | PRN
  Administered 2016-01-05: 1 via TOPICAL
  Filled 2016-01-03: qty 120

## 2016-01-03 MED ORDER — MORPHINE SULFATE (PF) 0.5 MG/ML IJ SOLN
INTRAMUSCULAR | Status: DC | PRN
  Administered 2016-01-03: 3 mg via EPIDURAL
  Administered 2016-01-03: 2 mg via INTRAVENOUS

## 2016-01-03 MED ORDER — SODIUM CHLORIDE 0.9 % IR SOLN
Status: DC | PRN
  Administered 2016-01-03: 1

## 2016-01-03 MED ORDER — OXYCODONE-ACETAMINOPHEN 5-325 MG PO TABS
1.0000 | ORAL_TABLET | ORAL | Status: DC | PRN
  Administered 2016-01-03 – 2016-01-04 (×5): 1 via ORAL
  Filled 2016-01-03 (×5): qty 1

## 2016-01-03 MED ORDER — MIDAZOLAM HCL 2 MG/2ML IJ SOLN
INTRAMUSCULAR | Status: DC | PRN
  Administered 2016-01-03: 2 mg via INTRAVENOUS

## 2016-01-03 MED ORDER — ONDANSETRON HCL 4 MG/2ML IJ SOLN
INTRAMUSCULAR | Status: DC | PRN
  Administered 2016-01-03: 4 mg via INTRAVENOUS

## 2016-01-03 MED ORDER — PRENATAL MULTIVITAMIN CH
1.0000 | ORAL_TABLET | Freq: Every day | ORAL | Status: DC
  Administered 2016-01-03 – 2016-01-05 (×3): 1 via ORAL
  Filled 2016-01-03 (×3): qty 1

## 2016-01-03 MED ORDER — KETAMINE HCL 100 MG/ML IJ SOLN
INTRAMUSCULAR | Status: DC | PRN
  Administered 2016-01-03: 30 mg via INTRAVENOUS
  Administered 2016-01-03 (×6): 20 mg via INTRAVENOUS

## 2016-01-03 SURGICAL SUPPLY — 37 items
APL SKNCLS STERI-STRIP NONHPOA (GAUZE/BANDAGES/DRESSINGS) ×1
BENZOIN TINCTURE PRP APPL 2/3 (GAUZE/BANDAGES/DRESSINGS) ×3 IMPLANT
CHLORAPREP W/TINT 26ML (MISCELLANEOUS) ×3 IMPLANT
CLAMP CORD UMBIL (MISCELLANEOUS) IMPLANT
CLOSURE STERI STRIP 1/2 X4 (GAUZE/BANDAGES/DRESSINGS) ×3 IMPLANT
CLOSURE WOUND 1/2 X4 (GAUZE/BANDAGES/DRESSINGS)
CLOTH BEACON ORANGE TIMEOUT ST (SAFETY) ×3 IMPLANT
DRAPE C SECTION CLR SCREEN (DRAPES) ×3 IMPLANT
DRSG OPSITE POSTOP 4X10 (GAUZE/BANDAGES/DRESSINGS) ×3 IMPLANT
ELECT REM PT RETURN 9FT ADLT (ELECTROSURGICAL) ×3
ELECTRODE REM PT RTRN 9FT ADLT (ELECTROSURGICAL) ×1 IMPLANT
EXTRACTOR VACUUM KIWI (MISCELLANEOUS) IMPLANT
GLOVE BIO SURGEON STRL SZ 6.5 (GLOVE) ×2 IMPLANT
GLOVE BIO SURGEONS STRL SZ 6.5 (GLOVE) ×1
GLOVE BIOGEL PI IND STRL 7.0 (GLOVE) ×1 IMPLANT
GLOVE BIOGEL PI INDICATOR 7.0 (GLOVE) ×2
GOWN STRL REUS W/TWL LRG LVL3 (GOWN DISPOSABLE) ×6 IMPLANT
KIT ABG SYR 3ML LUER SLIP (SYRINGE) IMPLANT
NEEDLE HYPO 25X5/8 SAFETYGLIDE (NEEDLE) IMPLANT
NS IRRIG 1000ML POUR BTL (IV SOLUTION) ×3 IMPLANT
PACK C SECTION WH (CUSTOM PROCEDURE TRAY) ×3 IMPLANT
PAD OB MATERNITY 4.3X12.25 (PERSONAL CARE ITEMS) ×3 IMPLANT
PENCIL SMOKE EVAC W/HOLSTER (ELECTROSURGICAL) ×3 IMPLANT
RTRCTR C-SECT PINK 25CM LRG (MISCELLANEOUS) IMPLANT
STRIP CLOSURE SKIN 1/2X4 (GAUZE/BANDAGES/DRESSINGS) IMPLANT
SUT CHROMIC 1 CTX 36 (SUTURE) ×9 IMPLANT
SUT PLAIN 0 NONE (SUTURE) IMPLANT
SUT PLAIN 2 0 XLH (SUTURE) ×6 IMPLANT
SUT VIC AB 0 CT1 27 (SUTURE) ×6
SUT VIC AB 0 CT1 27XBRD ANBCTR (SUTURE) ×2 IMPLANT
SUT VIC AB 2-0 CT1 27 (SUTURE) ×3
SUT VIC AB 2-0 CT1 TAPERPNT 27 (SUTURE) ×1 IMPLANT
SUT VIC AB 3-0 CT1 27 (SUTURE)
SUT VIC AB 3-0 CT1 TAPERPNT 27 (SUTURE) IMPLANT
SUT VIC AB 4-0 KS 27 (SUTURE) ×3 IMPLANT
TOWEL OR 17X24 6PK STRL BLUE (TOWEL DISPOSABLE) ×3 IMPLANT
TRAY FOLEY CATH SILVER 14FR (SET/KITS/TRAYS/PACK) ×3 IMPLANT

## 2016-01-03 NOTE — Addendum Note (Signed)
Addendum  created 01/03/16 1258 by Elgie CongoNataliya H Elinore Shults, CRNA   Sign clinical note

## 2016-01-03 NOTE — Anesthesia Rounding Note (Signed)
  CRNA Epidural Rounding Note  Patient: Lauren Ross, 25 y.o., female  Patient's current pain level: Pain Score: 0-No pain (01/03/16 0301)  Agreed upon pain management level: 5  Epidural intervention: Unable to assess - patient sleeping  Comments: patient comfortable  [facility]EIGHT,Lauren Ross 01/03/2016

## 2016-01-03 NOTE — Anesthesia Postprocedure Evaluation (Signed)
Anesthesia Post Note  Patient: Lauren Ross  Procedure(s) Performed: Procedure(s) (LRB): CESAREAN SECTION (N/A)  Patient location during evaluation: Mother Baby Anesthesia Type: Epidural Level of consciousness: awake and alert Pain management: pain level controlled Vital Signs Assessment: post-procedure vital signs reviewed and stable Respiratory status: spontaneous breathing and nonlabored ventilation Cardiovascular status: stable Postop Assessment: no headache, no backache, patient able to bend at knees, epidural receding, no signs of nausea or vomiting and adequate PO intake Anesthetic complications: no     Last Vitals:  Vitals:   01/03/16 1045 01/03/16 1145  BP: 138/72 125/67  Pulse: (!) 102 (!) 106  Resp: 20 18  Temp:  36.9 C    Last Pain:  Vitals:   01/03/16 1145  TempSrc: Oral  PainSc: 2    Pain Goal: Patients Stated Pain Goal: 0 (01/03/16 0700)               [name],Regie Bunner Hristova

## 2016-01-03 NOTE — Progress Notes (Signed)
Patient ID: Lauren SimmerChera Ross, female   DOB: Mar 08, 1991, 25 y.o.   MRN: [id number] D//w pt circumcision and baby has a ventral meatus that peds feels needs to be evaluated by urology prior to circumcision so will hold off for now and pt to call office and let us know if can proceed.

## 2016-01-03 NOTE — Lactation Note (Signed)
This note was copied from a baby's chart. Lactation Consultation Note  Patient Name: Lauren Wendie SimmerChera Sonn ZOXWR'UToday's Date: 01/03/2016 Reason for consult: Follow-up assessment;Other (Comment) (early term) Baby has been sleepy today and not latching. CGB 31 then 48. At this visit attempted to wake baby to BF but he was too sleepy. Reviewed hand expression with Mom and received 4 ml of EBM, attempted to wake baby to spoon feed but he was too sleepy but was able to finger feed using curved tipped syringe. Baby took 3 ml. Mom's breast tissue thick, tough at nipple/aerola base. Had Mom use hand pump prior to latch attempt and this did soften the tissue, baby just too sleepy. Mom's nipples have short shaft bilateral, baby have very recessed chin. Baby at 37.2 wks gest, reviewed LPT policy with Mom and encouraged Mom to post pump and supplement with each feeding per LPT guidelines starting with 5-10 ml today. Advised to offer breast each feeding, supplement according to LPT guidelines then post pump for 15 minutes to encourage milk production and to have EBM to supplement. Lactation brochure given to Mom for review, advised of OP services and support group. Encouraged Mom to call for assist with latch. Breast shells given to wear alternating with STS.   Maternal Data Has patient been taught Hand Expression?: Yes Does the patient have breastfeeding experience prior to this delivery?: No  Feeding Feeding Type: Breast Milk Length of feed: 0 min  LATCH Score/Interventions Latch: Too sleepy or reluctant, no latch achieved, no sucking elicited.              Intervention(s): Breastfeeding basics reviewed;Skin to skin     Lactation Tools Discussed/Used Tools: Pump;Shells Shell Type: Inverted Breast pump type: Double-Electric Breast Pump WIC Program: Yes Pump Review: Setup, frequency, and cleaning;Milk Storage Initiated by:: KG Date initiated:: 01/03/16   Consult Status Consult Status:  Follow-up Date: 01/04/16 Follow-up type: In-patient    Alfred LevinsGranger, Khan Chura Ann 01/03/2016, 3:43 PM

## 2016-01-03 NOTE — Progress Notes (Signed)
Patient ID: Lauren Ross, female   DOB: 1991-03-07, 25 y.o.   MRN: [id number] With pitocin up to 8mus and peanut ball placed, pt begun to have recurrent late decels.  Pitocin stopped and fluid bolus given SVE notes cervix unchanged at 5.5cm   Pt counseled implications of arrest of dilation especially if unable to restart pitocin will likely mean delivery via c/s  Will allow an hour off pitocin to see if baby recovers; if able to restart pitocin safely will do so otherwise will progress with delivery via c/s  All questions answered

## 2016-01-03 NOTE — Progress Notes (Addendum)
Anesthesiologist notified of CBG of 140--no new orders given

## 2016-01-03 NOTE — Anesthesia Postprocedure Evaluation (Signed)
Anesthesia Post Note  Patient: Wendie SimmerChera Bark  Procedure(s) Performed: Procedure(s) (LRB): CESAREAN SECTION (N/A)  Patient location during evaluation: PACU Anesthesia Type: Epidural Level of consciousness: awake Pain management: pain level controlled Vital Signs Assessment: post-procedure vital signs reviewed and stable Respiratory status: spontaneous breathing Cardiovascular status: stable Postop Assessment: no headache, no backache, epidural receding, no signs of nausea or vomiting and patient able to bend at knees Anesthetic complications: no     Last Vitals:  Vitals:   01/03/16 0745 01/03/16 0800  BP: 130/82 113/70  Pulse: (!) 103 (!) 102  Resp: 15 15  Temp:      Last Pain:  Vitals:   01/03/16 0745  TempSrc:   PainSc: 4    Pain Goal: Patients Stated Pain Goal: 0 (01/03/16 0700)               Sharine Cadle JR,JOHN [place]

## 2016-01-03 NOTE — Progress Notes (Signed)
Patient ID: Lauren Ross, female   DOB: 1990-05-31, 25 y.o.   MRN: [id number] Pitocin restarted after an hour and increased per protocol; peanut ball placed Pt rechecked after 2 hours with no cervical change noted.  C/o discomfort BP 150/80-90s  Discussed options : continued labor vs c/s. Pt agrees to c/s.          Will stop pitocin         Staff notified         To OR when ready for primary c/s

## 2016-01-03 NOTE — Transfer of Care (Signed)
Immediate Anesthesia Transfer of Care Note  Patient: Lauren Ross  Procedure(s) Performed: Procedure(s): CESAREAN SECTION (N/A)  Patient Location: PACU  Anesthesia Type:Epidural  Level of Consciousness: awake, alert  and oriented  Airway & Oxygen Therapy: Patient Spontanous Breathing  Post-op Assessment: Report given to RN and Post -op Vital signs reviewed and stable  Post vital signs: Reviewed and stable  Last Vitals:  Vitals:   01/03/16 0506 01/03/16 0511  BP: 125/75 129/77  Pulse: (!) 106 (!) 114  Resp: 18   Temp:      Last Pain:  Vitals:   01/03/16 0301  TempSrc: Oral  PainSc: 0-No pain         Complications: No apparent anesthesia complications

## 2016-01-03 NOTE — Lactation Note (Signed)
This note was copied from a baby's chart. Lactation Consultation Note Follow up visit at 14 hours of age.  Baby has a low glucose level of 38 with follow up 51 and 52 with formula supplementation ordered by Dr.  Pecola LeisureBaby did not tolerate syringe feeding well and was bottle fed by Rn.  LC discussed pumping plan with mom, she had pumped and collected about 1ml.  LC reviewed use of pump, frequency, cleaning and storage. LC attempted syringe feeding allowing baby to suck gloved finger.  Baby was to sleepy to rhythmically suck at this time.  Parents plan to wake baby for feeding every 2 1/2-3 hours and will offer EBM at next feeding prior to supplementation of formula.  LC discussed increasing volume of supplement as baby tolerates and parents to call RN if baby is not tolerating feedings.  Mom will continue to attempt latching with limiting total feeding times to 30 minutes.  FOB at bedside supportive.     Patient Name: Lauren Ross WUJWJ'XToday's Date: 01/03/2016 Reason for consult: Follow-up assessment   Maternal Data    Feeding Feeding Type: Formula  LATCH Score/Interventions                      Lactation Tools Discussed/Used Pump Review: Setup, frequency, and cleaning;Milk Storage   Consult Status Consult Status: Follow-up Date: 01/04/16 Follow-up type: In-patient    Lauren Ross, Lauren Ross 01/03/2016, 8:39 PM

## 2016-01-04 LAB — BIRTH TISSUE RECOVERY COLLECTION (PLACENTA DONATION)

## 2016-01-04 LAB — CBC
HEMATOCRIT: 26.8 % — AB (ref 36.0–46.0)
Hemoglobin: 9 g/dL — ABNORMAL LOW (ref 12.0–15.0)
MCH: 30.1 pg (ref 26.0–34.0)
MCHC: 33.6 g/dL (ref 30.0–36.0)
MCV: 89.6 fL (ref 78.0–100.0)
PLATELETS: 187 10*3/uL (ref 150–400)
RBC: 2.99 MIL/uL — ABNORMAL LOW (ref 3.87–5.11)
RDW: 14.7 % (ref 11.5–15.5)
WBC: 15.1 10*3/uL — ABNORMAL HIGH (ref 4.0–10.5)

## 2016-01-04 MED ORDER — ESCITALOPRAM OXALATE 10 MG PO TABS
10.0000 mg | ORAL_TABLET | Freq: Every day | ORAL | Status: DC
  Administered 2016-01-04 – 2016-01-06 (×3): 10 mg via ORAL
  Filled 2016-01-04 (×3): qty 1

## 2016-01-04 NOTE — Lactation Note (Signed)
This note was copied from a baby's chart. Lactation Consultation Note  Patient Name: Boy Wendie SimmerChera Kott XLKGM'WToday's Date: 01/04/2016 Reason for consult: Follow-up assessment   Follow up with mom of 33 hour old infant. Mom reports infant is BF much better today. Infant was sleeping in dad's arms, mom reports he recently fed. Mom reports she has given some supplementation but that infant does not like to supplement. She has a 5 fr feeding tube at bedside and said that she used it last night. Mom reports she has not pumped today.  Advised mom to pump every 3 hours post BF and that all EBM should be given to infant. Mom reports she feels the infant is satisfied with feedings and does not need supplement. Discussed with mom that if infant is not taking in enough calories he can get sleepier and not want to feed at all. He is also noted to be jaundiced appearing, enc mom to also supplement to assist with jaundice.   Mom reports she does not feel fuller today and she is experiencing mild nipple tenderness with latch that improves with feeding, enc mom to use EBM to nipples post pumping. Enc momt o call for feeding assistance as needed. Follow up tomorrow and prn.     Maternal Data    Feeding Feeding Type: Breast Fed Length of feed: 30 min  LATCH Score/Interventions                      Lactation Tools Discussed/Used Pump Review: Setup, frequency, and cleaning   Consult Status Consult Status: Follow-up Date: 01/05/16 Follow-up type: In-patient    Silas FloodSharon S Hser Belanger 01/04/2016, 3:33 PM

## 2016-01-04 NOTE — Clinical Social Work Maternal (Signed)
  CLINICAL SOCIAL WORK MATERNAL/CHILD NOTE  Patient Details  Name: Lauren Ross MRN: [id number] Date of Birth: [birth year]  Date:  01/04/2016  Clinical Social Worker Initiating Note:  Laurey Arrow Date/ Time Initiated:  01/04/16/1056     Child's Name:  Lauren Ross   Legal Guardian:  Mother   Need for Interpreter:  None   Date of Referral:  01/04/16     Reason for Referral:  Behavioral Health Issues, including SI    Referral Source:  [facility]   Address:  [address] [place] [address]  Phone number:  [phone]   Household Members:  Self, Siblings, Parents   Natural Supports (not living in the home):  Immediate Family, Spouse/significant other, Parent   Professional Supports: Therapist Furniture conservator/restorer Counseling)   Employment: Animator   Type of Work: Quarry manager   Education:  Contractor:  Multimedia programmer   Other Resources:  [facility]   Cultural/Religious Considerations Which May Impact Care:  None Reported  Strengths:  Ability to meet basic needs , Engineer, materials , Understanding of illness, Home prepared for child    Risk Factors/Current Problems:  Mental Health Concerns    Cognitive State:  Alert , Insightful , Linear Thinking    Mood/Affect:  Anxious , Comfortable , Interested    CSW Assessment: CSW met with MOB to complete an assessment for a consult for hx of anxiety and depression.  MOB was polite and inviting.  MOB introduced her room guest as FOB (Lauren Ross).  MOB gave CSW permission to speak with MOB while FOB was present.  CSW inquired about MOB's hx of anxiety and depression.  MOB acknowledged MOB's mental health hx and communicated to CSW that MOB requested to  discontinue to take medication after pregnancy confirmation.  MOB communicated that MOB felt good during pregnancy but since giving birth has felt anxious and overwhelmed.  CSW validated MOB's thoughts and feelings and  offered MOB resources for outpatient mental health counseling. MOB reports receiving medication and outpatient therapy with [facility]. MOB will contact counseling office to make an appointment after dc.  MOB informed CSW  that MOB wants to start medication for depression while MOB is in hospital.  MOB communicated to CSW that breastfeeding is important and it would be great if MOB could have medications that will allow MOB to continue to breastfeed.  CSW informed MOB that CSW with communicate MOB's request to MOB's bedside nurse. CSW educated about PPD and encouraged MOB to ask questions. CSW informed MOB of possible supports and interventions to decrease PPD.  CSW also encouraged MOB to seek medical attention if needed for increased signs and symptoms for PPD.  CSW also educated MOB about SIDS.  MOB asked appropriate questions and was knowledgeable. CSW thanked MOB for meeting with CSW, and MOB did not have any questions or concerns at this time.  Although FOB was present, FOB did not engage with CSW.  Infant was not in the room during CSW assessment.  Per MOB infant was taken to have an EKG.  CSW communicated MOB's request to MOB's bedside nurse.  MOB's nurse communicated that nurse with contact MOB's OBGYN practice and provide them with an update.  CSW Plan/Description:  Patient/Family Education , No Further Intervention Required/No Barriers to Discharge   Laurey Arrow, MSW, LCSW Clinical Social Work [phone]    Dimple Nanas, LCSW 01/04/2016, 11:04 AM

## 2016-01-04 NOTE — Lactation Note (Signed)
This note was copied from a baby's chart. Lactation Consultation Note  Patient Name: Boy Wendie SimmerChera Malay WUJWJ'XToday's Date: 01/04/2016 Reason for consult: Follow-up assessment  I walked into room and found Mom sleeping with infant on her chest. I woke Mom and reminded her of not sleeping w/infant in bed. Mom then up to bathroom, commenting on feeling pain. I requested pain medicine from RN. Upon returning from the bathroom, she reported increased pain & when sitting on the bed, she said she felt like she would "pass out" from the pain. I assisted Mom into bed and lowered the head of the bed.  RN in to give pain medicine. Mom in considerable pain & I cup fed infant formula w/permission. Infant does not cup feed well, so Mom gave me permission to use a bottle.   During this time, Mom was having a "melt-down." More pain medicine was given by RN & I encouraged Mom to focus on resting/sleeping for the evening. I obtained permission from Mom for MGM to bottle-feed infant overnight so that Mom can rest.   Mom restarted her Lexapro (L2) on 9/20. Mom has also been on Abilify, which is an L3, if Mom were to restart it.   Mom at high risk for PPMD: hx of anxiety/depression; she lives alone; & traumatic birth experience (per Mom, her epidural stopped working during the C-Section & she felt "everything" and was then put under general anesthesia). Msg left w/social worker.  Note: On visual inspection, it appears that the size 24 flanges would be the right fit for her, but Mom says that the size 27 flanges are more comfortable.  Lurline HareRichey, Baker Kogler [facility] 01/04/2016, 11:59 PM

## 2016-01-04 NOTE — Progress Notes (Signed)
Subjective: Postpartum Day 1: Cesarean Delivery Patient reports incisional pain, tolerating PO and no problems voiding.  Nl lochia, pain controlled.    Objective: Vital signs in last 24 hours: Temp:  [97.8 F (36.6 C)-98.7 F (37.1 C)] 97.8 F (36.6 C) (09/20 0417) Pulse Rate:  [90-112] 98 (09/20 0417) Resp:  [16-20] 16 (09/20 0417) BP: (114-142)/(65-79) 114/65 (09/20 0417) SpO2:  [94 %-97 %] 97 % (09/19 2001)  Physical Exam:  General: alert and no distress Lochia: appropriate Uterine Fundus: firm Incision: healing well DVT Evaluation: No evidence of DVT seen on physical exam.   Recent Labs  01/02/16 0125 01/04/16 0549  HGB 13.4 9.0*  HCT 39.0 26.8*    Assessment/Plan: Status post Cesarean section. Doing well postoperatively.  Continue current care.  Bovard-Stuckert, Dawnisha Marquina 01/04/2016, 9:16 AM

## 2016-01-05 NOTE — Progress Notes (Signed)
CSW met with MOB to re-assess for psychosocial concerns after CSW received a concerning voicemail message from Lactation consultant, Kimberley Rich. When CSW arrived, MOB was alert and was attempting to breastfeed.  MOB's mother was present and appeared to be supporteive of MOB while MOB was breastfeeding.  CSW assisted MOB with processing her thoughts and feelings about last night.  MOB acknowledged that MOB felt overwhelmed and anxious.  MOB denied SI.  MOB communicated her feelings of being overwhelmed were attributed by lack of pain medication and the need for pain medications.  MOB reports having a scheduled appointment in 2 weeks with therapist.  CSW praised MOB for being proactive.  CSW provided MOB with a flyer for free support groups offered by [facility].  CSW reminded MOB to contact CSW if a need arise.   Angel Boyd-Gilyard, MSW, LCSW Clinical Social Work [phone]  

## 2016-01-05 NOTE — Plan of Care (Signed)
Problem: Nutritional: Goal: Mother's verbalization of comfort with breastfeeding process will improve Outcome: Completed/Met Date Met: 01/05/16 Encouraged to call for latch score/assist  Problem: Pain Management: Goal: General experience of comfort will improve and pain level will decrease Outcome: Completed/Met Date Met: 01/05/16 Encouraged pt to stay on routine schedule of pain medication for first few days after surger and increase ambulation in hallway.

## 2016-01-05 NOTE — Lactation Note (Signed)
This note was copied from a baby's chart. Lactation Consultation Note  Patient Name: Lauren Ross ZOXWR'UToday's Date: 01/05/2016 Reason for consult: Follow-up assessment Baby at 61 hr of life. Mom reports baby is latching well but she does not have enough milk yet. She does think her milk is "coming in" because she can feel the milk moving in her breast. She stated pumping hurts her nipples. Given 30 mm flanges, reviewed lubricating flanges with coconut oil, and keeping the suction low. RN to give coconut oil. She will latch baby on demand 8+/24hr, f/u bf with DEBP, and feed back her milk plus any formula needed to meet the supplementing guidelines. She is aware of lactation services and support group. She will call as needed.   Maternal Data    Feeding Feeding Type: Breast Fed  LATCH Score/Interventions                      Lactation Tools Discussed/Used     Consult Status Consult Status: Follow-up Date: 01/06/16 Follow-up type: In-patient    [name] 01/05/2016, 7:12 PM

## 2016-01-05 NOTE — Progress Notes (Signed)
POD #2 Doing ok, still sore Afeb, VSS Abd- soft, fundus firm, incision intact Continue routine care

## 2016-01-06 MED ORDER — OXYCODONE-ACETAMINOPHEN 5-325 MG PO TABS: 1.0000 | ORAL_TABLET | ORAL | 0 refills | Status: DC | PRN

## 2016-01-06 MED ORDER — IBUPROFEN 600 MG PO TABS
600.0000 mg | ORAL_TABLET | Freq: Four times a day (QID) | ORAL | 1 refills | Status: DC | PRN
Start: 2016-01-06 — End: 2016-11-03

## 2016-01-06 NOTE — Discharge Summary (Signed)
OB Discharge Summary     Patient Name: Lauren Ross DOB: 30-Sep-1990 MRN: [id number]  Date of admission: 01/02/2016 Delivering MD: Pryor OchoaBANGA, Kazue Cerro [facility]   Date of discharge: 01/06/2016  Admitting diagnosis: DIRECT ADMIT INDUCTION Intrauterine pregnancy: 2577w2d     Secondary diagnosis:  Active Problems:   Term pregnancy   Arrest of dilation, delivered, current hospitalization   Cesarean delivery delivered   Postpartum care following cesarean delivery  Additional problems: none     Discharge diagnosis: Term Pregnancy Delivered and GDM A2                                                                                                Post partum procedures:none  Augmentation: AROM, Pitocin, Cytotec and Foley Balloon  Complications: None  Hospital course:  Induction of Labor With Cesarean Section  25 y.o. yo G1P1001 at 3177w2d was admitted to the hospital 01/02/2016 for induction of labor. Patient had a labor course significant for protracted active phase. The patient went for cesarean section due to Arrest of Dilation, and delivered a Viable infant,@BABYSUPPRESS (DBLINK,ept,110,,1,,) Membrane Rupture Time/Date: )8:21 PM ,01/02/2016   @Details  of operation can be found in separate operative Note.  Patient had an uncomplicated postpartum course. She is ambulating, tolerating a regular diet, passing flatus, and urinating well.  Patient is discharged home in stable condition on 01/06/16.                                     Physical exam Vitals:   01/04/16 1940 01/05/16 0627 01/05/16 1800 01/06/16 0600  BP: 128/79 113/70 122/80 123/73  Pulse: (!) 113 100 (!) 101 (!) 108  Resp: 18 18 19 20   Temp:  98 F (36.7 C) 98.4 F (36.9 C) 98.2 F (36.8 C)  TempSrc:  Oral Oral Oral  SpO2:  98%    Weight:      Height:       General: alert, cooperative and no distress Lochia: appropriate Uterine Fundus: firm Incision: Dressing is clean, dry, and intact DVT Evaluation: No evidence of DVT seen  on physical exam. Labs: Lab Results  Component Value Date   WBC 15.1 (H) 01/04/2016   HGB 9.0 (L) 01/04/2016   HCT 26.8 (L) 01/04/2016   MCV 89.6 01/04/2016   PLT 187 01/04/2016   CMP Latest Ref Rng & Units 01/02/2016  Glucose 65 - 99 mg/dL 66  BUN 6 - 20 mg/dL 11  Creatinine 7.820.44 - 9.561.00 mg/dL 2.130.63  Sodium 086135 - 578145 mmol/L 137  Potassium 3.5 - 5.1 mmol/L 3.8  Chloride 101 - 111 mmol/L 109  CO2 22 - 32 mmol/L 19(L)  Calcium 8.9 - 10.3 mg/dL 9.0  Total Protein 6.5 - 8.1 g/dL 6.3(L)  Total Bilirubin 0.3 - 1.2 mg/dL 0.5  Alkaline Phos 38 - 126 U/L 134(H)  AST 15 - 41 U/L 16  ALT 14 - 54 U/L 14    Discharge instruction: per After Visit Summary and "Baby and Me Booklet".  After visit meds:    Medication  List    STOP taking these medications   glyBURIDE 5 MG tablet Commonly known as:  DIABETA     TAKE these medications   acetaminophen 500 MG tablet Commonly known as:  TYLENOL Take 500 mg by mouth every 6 (six) hours as needed.   calcium carbonate 500 MG chewable tablet Commonly known as:  TUMS - dosed in mg elemental calcium Chew 1 tablet by mouth daily.   ibuprofen 600 MG tablet Commonly known as:  ADVIL,MOTRIN Take 1 tablet (600 mg total) by mouth every 6 (six) hours as needed for mild pain, moderate pain or cramping.   oxyCODONE-acetaminophen 5-325 MG tablet Commonly known as:  PERCOCET/ROXICET Take 1-2 tablets by mouth every 4 (four) hours as needed for severe pain (pain scale 4-7).   prenatal multivitamin Tabs tablet Take 1 tablet by mouth daily at 12 noon.       Diet: routine diet  Activity: Advance as tolerated. Pelvic rest for 6 weeks.   Outpatient follow up:2 weeks Follow up Appt:No future appointments. Follow up Visit:No Follow-up on file.  Postpartum contraception: nexplanon or iud  Newborn Data: Live born female  Birth Weight: 7 lb 15.7 oz (3620 g) APGAR: 9, 9  Baby Feeding: Breast Disposition:home with mother   01/06/2016 Edwinna Areola, DO

## 2016-01-06 NOTE — Discharge Instructions (Signed)
Nothing in vagina for 6 weeks.  No sex, tampons, and douching.  Other instructions as in [facility] Discharge Booklet. °

## 2016-01-06 NOTE — Progress Notes (Signed)
Subjective: Postpartum Day 3: Cesarean Delivery Patient reports incisional pain, tolerating PO, + flatus and no problems voiding.    Objective: Vital signs in last 24 hours: Temp:  [98.2 F (36.8 C)-98.4 F (36.9 C)] 98.2 F (36.8 C) (09/22 0600) Pulse Rate:  [101-108] 108 (09/22 0600) Resp:  [19-20] 20 (09/22 0600) BP: (122-123)/(73-80) 123/73 (09/22 0600)  Physical Exam:  General: alert, cooperative and no distress Lochia: appropriate Uterine Fundus: firm Incision: no significant drainage DVT Evaluation: No evidence of DVT seen on physical exam.   Recent Labs  01/04/16 0549  HGB 9.0*  HCT 26.8*    Assessment/Plan: Status post Cesarean section. Doing well postoperatively.  Discharge home with standard precautions and return to clinic in 2-6 weeks.  Lauren Ross 01/06/2016, 10:12 AM

## 2016-01-06 NOTE — Lactation Note (Signed)
This note was copied from a baby's chart. Lactation Consultation Note  Baby latched upon entering in cross cradle position. Sucks and swallows observed.  LS10. Mother states nipple soreness has improved. Recommend she compress/massage breast during feedings. Reviewed engorgement care and monitoring voids/stools. Reviewed Baby & Me booklet. Mom encouraged to feed baby 8-12 times/24 hours and with feeding cues.    Patient Name: Lauren Wendie SimmerChera Range OZHYQ'MToday's Date: 01/06/2016 Reason for consult: Follow-up assessment   Maternal Data    Feeding Feeding Type: Breast Fed Length of feed: 10 min  LATCH Score/Interventions Latch: Grasps breast easily, tongue down, lips flanged, rhythmical sucking. (latched upon entering room)  Audible Swallowing: Spontaneous and intermittent  Type of Nipple: Everted at rest and after stimulation  Comfort (Breast/Nipple): Soft / non-tender  Problem noted: Filling Interventions (Filling): Massage;Double electric pump  Hold (Positioning): No assistance needed to correctly position infant at breast.  LATCH Score: 10  Lactation Tools Discussed/Used     Consult Status Consult Status: Complete    Hardie PulleyBerkelhammer, Rilya Longo Boschen 01/06/2016, 10:54 AM

## 2016-01-08 NOTE — Op Note (Signed)
Cesarean Section Procedure Note  Indications: failure to progress: arrest of dilation  Pre-operative Diagnosis: 37 week 2 day pregnancy. GDMA2, cholestasis of pregnancy  Post-operative Diagnosis: same  Surgeon: Edwinna Areolaecilia Worema Nicholus Chandran   Assistants: 2nd scrub  Anesthesia: Epidural anesthesia  Arrest of descent at 5.5cm dilation for 9hours; afeb, cat 2 strip    Procedure Details   The patient was seen in the Holding Room. The risks, benefits, complications, treatment options, and expected outcomes were discussed with the patient.  The patient concurred with the proposed plan, giving informed consent.  The site of surgery properly noted/marked. The patient was taken to Operating Room # 1, identified as Lauren Ross and the procedure verified as C-Section Delivery. A Time Out was held and the above information confirmed.  After induction of anesthesia, the patient was draped and prepped in the usual sterile manner. A Pfannenstiel incision was made and carried down through the subcutaneous tissue to the fascia. Fascial incision was made and extended transversely. The fascia was separated from the underlying rectus tissue superiorly and inferiorly. The peritoneum was identified and entered. Peritoneal incision was extended longitudinally.Jon Gillslexis was placed.  The utero-vesical peritoneal reflection was incised transversely and the bladder flap was bluntly freed from the lower uterine segment. A low transverse uterine incision was made. Delivered from cephalic presentation was a  Female with Apgar scores of 8 at one minute and 9 at five minutes. After the umbilical cord was clamped and cut cord blood was obtained for evaluation. The placenta was removed intact and appeared normal. The uterine outline, tubes and ovaries appeared normal. The uterine incision was closed with running locked sutures of Chromic. Hemostasis was observed. Lavage was carried out until clear. The fascia was then  reapproximated with running sutures of o vicryl. The skin was reapproximated with 4-0 vicryl.  Instrument, sponge, and needle counts were correct prior the abdominal closure and at the conclusion of the case.   Findings: Viable female infant ; grossly nl uterus tubes and ovaries  Estimated Blood Loss:  800         Drains: none         Total IV Fluids:  1600ml         Specimens: Placenta to L/D          Implants: none          Complications:  Pain control - given ketamine          Disposition: PACU - hemodynamically stable.         Condition: stable  Attending Attestation: I was present and scrubbed for the entire procedure.

## 2016-01-11 ENCOUNTER — Encounter (HOSPITAL_COMMUNITY): Payer: Self-pay

## 2016-01-11 ENCOUNTER — Inpatient Hospital Stay (HOSPITAL_COMMUNITY)
Admission: AD | Admit: 2016-01-11 | Discharge: 2016-01-14 | DRG: 776 | Disposition: A | Discharge status: Home / Self Care | Payer: Managed Care, Other (non HMO) | Source: Ambulatory Visit | Attending: Obstetrics and Gynecology | Admitting: Obstetrics and Gynecology

## 2016-01-11 DIAGNOSIS — O8620 Urinary tract infection following delivery, unspecified: Secondary | ICD-10-CM | POA: Diagnosis present

## 2016-01-11 DIAGNOSIS — D72829 Elevated white blood cell count, unspecified: Secondary | ICD-10-CM | POA: Diagnosis present

## 2016-01-11 DIAGNOSIS — O864 Pyrexia of unknown origin following delivery: Principal | ICD-10-CM | POA: Diagnosis present

## 2016-01-11 LAB — COMPREHENSIVE METABOLIC PANEL
ALBUMIN: 2.4 g/dL — AB (ref 3.5–5.0)
ALT: 26 U/L (ref 14–54)
AST: 20 U/L (ref 15–41)
Alkaline Phosphatase: 211 U/L — ABNORMAL HIGH (ref 38–126)
Anion gap: 8 (ref 5–15)
BILIRUBIN TOTAL: 0.2 mg/dL — AB (ref 0.3–1.2)
BUN: 15 mg/dL (ref 6–20)
CO2: 27 mmol/L (ref 22–32)
CREATININE: 1 mg/dL (ref 0.44–1.00)
Calcium: 8.7 mg/dL — ABNORMAL LOW (ref 8.9–10.3)
Chloride: 105 mmol/L (ref 101–111)
GFR calc Af Amer: >60 mL/min (ref >60)
GLUCOSE: 99 mg/dL (ref 65–99)
POTASSIUM: 3.4 mmol/L — AB (ref 3.5–5.1)
Sodium: 140 mmol/L (ref 135–145)
TOTAL PROTEIN: 7 g/dL (ref 6.5–8.1)

## 2016-01-11 LAB — CBC WITH DIFFERENTIAL/PLATELET
Basophils Absolute: 0 10*3/uL (ref 0.0–0.1)
Basophils Relative: 0 %
EOS ABS: 0.3 10*3/uL (ref 0.0–0.7)
EOS PCT: 2 %
HEMATOCRIT: 30.7 % — AB (ref 36.0–46.0)
HEMOGLOBIN: 10.2 g/dL — AB (ref 12.0–15.0)
LYMPHS ABS: 1.9 10*3/uL (ref 0.7–4.0)
Lymphocytes Relative: 14 %
MCH: 29.7 pg (ref 26.0–34.0)
MCHC: 33.2 g/dL (ref 30.0–36.0)
MCV: 89.2 fL (ref 78.0–100.0)
MONOS PCT: 7 %
Monocytes Absolute: 1 10*3/uL (ref 0.1–1.0)
NEUTROS ABS: 10.5 10*3/uL — AB (ref 1.7–7.7)
Neutrophils Relative %: 77 %
OTHER: 0 %
Platelets: 390 10*3/uL (ref 150–400)
RBC: 3.44 MIL/uL — ABNORMAL LOW (ref 3.87–5.11)
RDW: 13.7 % (ref 11.5–15.5)
WBC: 13.7 10*3/uL — AB (ref 4.0–10.5)

## 2016-01-11 MED ORDER — IBUPROFEN 600 MG PO TABS
600.0000 mg | ORAL_TABLET | Freq: Four times a day (QID) | ORAL | Status: DC | PRN
  Administered 2016-01-11 – 2016-01-13 (×6): 600 mg via ORAL
  Filled 2016-01-11 (×6): qty 1

## 2016-01-11 MED ORDER — ONDANSETRON HCL 4 MG/2ML IJ SOLN
4.0000 mg | Freq: Four times a day (QID) | INTRAMUSCULAR | Status: DC | PRN
  Administered 2016-01-12: 4 mg via INTRAVENOUS
  Filled 2016-01-11: qty 2

## 2016-01-11 MED ORDER — OXYCODONE-ACETAMINOPHEN 5-325 MG PO TABS
1.0000 | ORAL_TABLET | Freq: Four times a day (QID) | ORAL | Status: DC | PRN
  Administered 2016-01-11: 2 via ORAL
  Administered 2016-01-11 – 2016-01-13 (×6): 1 via ORAL
  Filled 2016-01-11: qty 1
  Filled 2016-01-11: qty 2
  Filled 2016-01-11 (×5): qty 1

## 2016-01-11 MED ORDER — M.V.I. ADULT IV INJ
INJECTION | Freq: Once | INTRAVENOUS | Status: AC
  Administered 2016-01-11: 19:00:00 via INTRAVENOUS
  Filled 2016-01-11 (×2): qty 1000

## 2016-01-11 MED ORDER — SODIUM CHLORIDE 0.9 % IV SOLN
3.0000 g | Freq: Four times a day (QID) | INTRAVENOUS | Status: DC
  Administered 2016-01-11 – 2016-01-12 (×3): 3 g via INTRAVENOUS
  Filled 2016-01-11 (×5): qty 3

## 2016-01-11 MED ORDER — ONDANSETRON HCL 4 MG PO TABS
4.0000 mg | ORAL_TABLET | Freq: Four times a day (QID) | ORAL | Status: DC | PRN
  Administered 2016-01-13 – 2016-01-14 (×3): 4 mg via ORAL
  Filled 2016-01-11 (×3): qty 1

## 2016-01-11 MED ORDER — PRENATAL MULTIVITAMIN CH
1.0000 | ORAL_TABLET | Freq: Every day | ORAL | Status: DC
  Administered 2016-01-12 – 2016-01-13 (×2): 1 via ORAL
  Filled 2016-01-11 (×2): qty 1

## 2016-01-11 NOTE — H&P (Signed)
Lauren Ross is an 25 y.o. female. Pt delivered via c/s on 01/06/16 after arrest of dilation; she was induced for labor at [redacted] weeks ga for cholestasis of pregnancy and GDMA2. Pt reports begun having general malaise 3-4days ago however had no elevated temps or pain. In last 24hrs she has been having fever/chills and feeling even worse. Pt presented to the office and was noted to have a temp of 103. Pt was sent to the hospital to be direct admitted for management of symptoms  Pertinent Gynecological History: Menses:postpartum DES exposure: denies Blood transfusions: none Sexually transmitted diseases: no past history Previous GYN Procedures: none  OB History: G1, P1001   Menstrual History: Menarche age: 25 No LMP recorded.    Past Medical History:  Diagnosis Date  . Cholestasis of pregnancy in third trimester   . Depression   . Gestational diabetes    glyburide  . Hx of varicella   . Polycystic ovary syndrome   . Pregnant     Past Surgical History:  Procedure Laterality Date  . CESAREAN SECTION N/A 01/03/2016   Procedure: CESAREAN SECTION;  Surgeon: Edwinna Areola, DO;  Location: [facility];  Service: Obstetrics;  Laterality: N/A;  . CHOLECYSTECTOMY    . KIDNEY STONE SURGERY    . WISDOM TOOTH EXTRACTION      No family history on file.  Social History:  reports that she has never smoked. She has never used smokeless tobacco. She reports that she drinks alcohol. Her drug history is not on file.  Allergies:  Allergies  Allergen Reactions  . Bee Venom Hives and Swelling    Prescriptions Prior to Admission  Medication Sig Dispense Refill Last Dose  . acetaminophen (TYLENOL) 500 MG tablet Take 500 mg by mouth every 6 (six) hours as needed.   Past Week at Unknown time  . calcium carbonate (TUMS - DOSED IN MG ELEMENTAL CALCIUM) 500 MG chewable tablet Chew 1 tablet by mouth daily.   Past Week at Unknown time  . ibuprofen (ADVIL,MOTRIN) 600 MG tablet Take 1 tablet  (600 mg total) by mouth every 6 (six) hours as needed for mild pain, moderate pain or cramping. 60 tablet 1   . oxyCODONE-acetaminophen (PERCOCET/ROXICET) 5-325 MG tablet Take 1-2 tablets by mouth every 4 (four) hours as needed for severe pain (pain scale 4-7). 30 tablet 0   . Prenatal Vit-Fe Fumarate-FA (PRENATAL MULTIVITAMIN) TABS tablet Take 1 tablet by mouth daily at 12 noon.   Past Week at Unknown time    Review of Systems  Constitutional: Positive for chills, fever and malaise/fatigue.  Eyes: Negative for blurred vision.  Respiratory: Negative for shortness of breath.   Cardiovascular: Negative for chest pain, palpitations and leg swelling.  Gastrointestinal: Positive for abdominal pain. Negative for heartburn, nausea and vomiting.  Genitourinary: Positive for frequency and hematuria. Negative for dysuria.  Musculoskeletal: Positive for joint pain and myalgias.  Skin: Negative for itching and rash.  Neurological: Positive for weakness. Negative for headaches.    Blood pressure 109/65, pulse (!) 138, temperature 98.6 F (37 C), temperature source Axillary, resp. rate 18, SpO2 99 %, unknown if currently breastfeeding. Physical Exam  Constitutional: She is oriented to person, place, and time. She appears well-developed and well-nourished.  Neck: Normal range of motion.  Cardiovascular: Intact distal pulses.   Respiratory: Effort normal.  GI: Soft. She exhibits no distension. There is no tenderness. There is no rebound and no guarding.  Incision c/d/i; no drainage or erythema  Genitourinary:  Vagina normal and uterus normal.  Musculoskeletal: Normal range of motion. She exhibits no edema.  Neurological: She is alert and oriented to person, place, and time.  Skin: Skin is warm. No rash noted.  Psychiatric: She has a normal mood and affect. Her behavior is normal. Judgment and thought content normal.    No results found for this or any previous visit (from the past 24 hour(s)).  No  results found.  Assessment/Plan: G1P1001 on POD #5 s/p Pltc/s with fever Obtain urine culture; check cbc and cmp Start on iv antibx; fluids Recheck labs in am Pain control/post op care  [facility] IncCecilia Worema Banga 01/11/2016, 5:48 PM

## 2016-01-12 DIAGNOSIS — R5381 Other malaise: Secondary | ICD-10-CM | POA: Diagnosis present

## 2016-01-12 DIAGNOSIS — D72829 Elevated white blood cell count, unspecified: Secondary | ICD-10-CM | POA: Diagnosis present

## 2016-01-12 DIAGNOSIS — O864 Pyrexia of unknown origin following delivery: Secondary | ICD-10-CM | POA: Diagnosis present

## 2016-01-12 DIAGNOSIS — O8620 Urinary tract infection following delivery, unspecified: Secondary | ICD-10-CM | POA: Diagnosis present

## 2016-01-12 LAB — CBC WITH DIFFERENTIAL/PLATELET
BASOS ABS: 0 10*3/uL (ref 0.0–0.1)
Basophils Absolute: 0 10*3/uL (ref 0.0–0.1)
Basophils Relative: 0 %
Basophils Relative: 0 %
Blasts: 0 %
EOS ABS: 0.4 10*3/uL (ref 0.0–0.7)
EOS ABS: 0.6 10*3/uL (ref 0.0–0.7)
EOS PCT: 2 %
EOS PCT: 2 %
HCT: 28.7 % — ABNORMAL LOW (ref 36.0–46.0)
HEMATOCRIT: 28.8 % — AB (ref 36.0–46.0)
HEMOGLOBIN: 9.4 g/dL — AB (ref 12.0–15.0)
Hemoglobin: 9.6 g/dL — ABNORMAL LOW (ref 12.0–15.0)
LYMPHS ABS: 1.3 10*3/uL (ref 0.7–4.0)
Lymphocytes Relative: 7 %
Lymphocytes Relative: 6 %
Lymphs Abs: 1.6 10*3/uL (ref 0.7–4.0)
MCH: 29.6 pg (ref 26.0–34.0)
MCH: 29.7 pg (ref 26.0–34.0)
MCHC: 33.4 g/dL (ref 30.0–36.0)
MCHC: 32.6 g/dL (ref 30.0–36.0)
MCV: 88.6 fL (ref 78.0–100.0)
MCV: 91.1 fL (ref 78.0–100.0)
MONO ABS: 0.9 10*3/uL (ref 0.1–1.0)
MONO ABS: 1.1 10*3/uL — AB (ref 0.1–1.0)
MONOS PCT: 4 %
Metamyelocytes Relative: 0 %
Monocytes Relative: 5 %
Myelocytes: 0 %
NEUTROS ABS: 22.3 10*3/uL — AB (ref 1.7–7.7)
NEUTROS PCT: 86 %
NEUTROS PCT: 87 %
Neutro Abs: 16.3 10*3/uL — ABNORMAL HIGH (ref 1.7–7.7)
OTHER: 0 %
Other: 0 %
PLATELETS: 366 10*3/uL (ref 150–400)
PROMYELOCYTES ABS: 0 %
Platelets: 392 10*3/uL (ref 150–400)
RBC: 3.24 MIL/uL — AB (ref 3.87–5.11)
RBC: 3.16 MIL/uL — AB (ref 3.87–5.11)
RDW: 13.8 % (ref 11.5–15.5)
RDW: 13.7 % (ref 11.5–15.5)
WBC: 18.9 10*3/uL — AB (ref 4.0–10.5)
WBC: 25.5 10*3/uL — AB (ref 4.0–10.5)

## 2016-01-12 LAB — URINALYSIS, ROUTINE W REFLEX MICROSCOPIC
BILIRUBIN URINE: NEGATIVE
Glucose, UA: NEGATIVE mg/dL
KETONES UR: NEGATIVE mg/dL
NITRITE: NEGATIVE
PROTEIN: 30 mg/dL — AB
SPECIFIC GRAVITY, URINE: 1.01 (ref 1.005–1.030)
pH: 6 (ref 5.0–8.0)

## 2016-01-12 LAB — COMPREHENSIVE METABOLIC PANEL
ALBUMIN: 2.2 g/dL — AB (ref 3.5–5.0)
ALT: 23 U/L (ref 14–54)
AST: 17 U/L (ref 15–41)
Alkaline Phosphatase: 198 U/L — ABNORMAL HIGH (ref 38–126)
Anion gap: 9 (ref 5–15)
BUN: 18 mg/dL (ref 6–20)
CHLORIDE: 105 mmol/L (ref 101–111)
CO2: 26 mmol/L (ref 22–32)
CREATININE: 0.95 mg/dL (ref 0.44–1.00)
Calcium: 8.5 mg/dL — ABNORMAL LOW (ref 8.9–10.3)
GFR calc Af Amer: >60 mL/min (ref >60)
GFR calc non Af Amer: >60 mL/min (ref >60)
Glucose, Bld: 122 mg/dL — ABNORMAL HIGH (ref 65–99)
POTASSIUM: 3.1 mmol/L — AB (ref 3.5–5.1)
SODIUM: 140 mmol/L (ref 135–145)
Total Bilirubin: 0.2 mg/dL — ABNORMAL LOW (ref 0.3–1.2)
Total Protein: 6.4 g/dL — ABNORMAL LOW (ref 6.5–8.1)

## 2016-01-12 LAB — URINE MICROSCOPIC-ADD ON

## 2016-01-12 LAB — HEPARIN ANTI-XA: Heparin LMW: <0.1 IU/mL

## 2016-01-12 MED ORDER — LACTATED RINGERS IV SOLN
INTRAVENOUS | Status: DC
  Administered 2016-01-12 – 2016-01-13 (×2): via INTRAVENOUS

## 2016-01-12 MED ORDER — ENOXAPARIN SODIUM 100 MG/ML ~~LOC~~ SOLN
100.0000 mg | Freq: Once | SUBCUTANEOUS | Status: AC
  Administered 2016-01-12: 100 mg via SUBCUTANEOUS
  Filled 2016-01-12: qty 1

## 2016-01-12 MED ORDER — METRONIDAZOLE IN NACL 5-0.79 MG/ML-% IV SOLN
500.0000 mg | Freq: Two times a day (BID) | INTRAVENOUS | Status: DC
  Administered 2016-01-12 – 2016-01-14 (×5): 500 mg via INTRAVENOUS
  Filled 2016-01-12 (×6): qty 100

## 2016-01-12 MED ORDER — CIPROFLOXACIN IN D5W 200 MG/100ML IV SOLN
200.0000 mg | Freq: Two times a day (BID) | INTRAVENOUS | Status: DC
  Administered 2016-01-12 – 2016-01-14 (×5): 200 mg via INTRAVENOUS
  Filled 2016-01-12 (×6): qty 100

## 2016-01-12 MED ORDER — ENOXAPARIN SODIUM 100 MG/ML ~~LOC~~ SOLN
100.0000 mg | Freq: Two times a day (BID) | SUBCUTANEOUS | Status: DC
  Administered 2016-01-13 – 2016-01-14 (×3): 100 mg via SUBCUTANEOUS
  Filled 2016-01-12 (×3): qty 1

## 2016-01-12 NOTE — Progress Notes (Signed)
Patient ID: Lauren SimmerChera Ross, female   DOB: [birth year], 25 y.o.   MRN: [id number]  Reviewed plan of care with pt. Feels well - was sleeping at time of temp of 103. Denies fever or chills.  All questions answered Switched to inpt Discharge once leukocytosis resolving and afeb x>24hrs

## 2016-01-12 NOTE — Progress Notes (Signed)
Patient ID: Lauren SimmerChera Ross, female   DOB: 1990/11/11, 25 y.o.   MRN: [id number]   Pt with another spike in temp this afternoon. Slowing decreasing - normal range now Nl BP, pulse 120  Wbc 25.5  A/P: Awaiting culture from urine        Start on ivf: LR at 50ml        Start on heparin daily

## 2016-01-12 NOTE — Progress Notes (Addendum)
ANTICOAGULATION CONSULT NOTE - Initial Consult  Pharmacy Consult for Lovenox Indication: Suspected SPT  Allergies  Allergen Reactions  . Bee Venom Hives and Swelling    Patient Measurements:   Weight: 105.2kg  Vital Signs: Temp: 98.9 F (37.2 C) (09/28 1700) Temp Source: Oral (09/28 1700) BP: 118/67 (09/28 1700) Pulse Rate: 120 (09/28 1700)  Labs:  Recent Labs  01/11/16 1827 01/12/16 0525 01/12/16 1455  HGB 10.2* 9.6* 9.4*  HCT 30.7* 28.7* 28.8*  PLT 390 366 392  CREATININE 1.00 0.95  --     Estimated Creatinine Clearance: 102.1 mL/min (by C-G formula based on SCr of 0.95 mg/dL).   Medical History: Past Medical History:  Diagnosis Date  . Cholestasis of pregnancy in third trimester   . Depression   . Gestational diabetes    glyburide  . Hx of varicella   . Polycystic ovary syndrome   . Pregnant     Medications:  Unasyn 3 gram IV q6h  9/27>> 9/28 Cipro 200mg  IV q12h  9/28 >> Flagyl 500mg  IV q8h >>  Assessment: 25yo s/p Csection on 9/22 admitted on 9/27 with fever, chills and malaise. Pt has continued to spike temps despite IV antibiotics. Lovenox initiated to r/o SPT. Goal of Therapy:  4 hour Heparin level= 0.6-1 Monitor platelets by anticoagulation protocol: Yes   Plan:  1. Lovenox 100mg  sq q12h 2. Will continue to follow and assess need for further intervention.  Claybon Jabsngel, Gearld Kerstein G 01/12/2016,6:25 PM   Noted: Flagyl 500mg  IV is scheduled q12h not q8h.

## 2016-01-12 NOTE — Progress Notes (Signed)
Patient ID: Lauren Ross, female   DOB: [birth year], 25 y.o.   MRN: [id number] Pt feels well but tired. Denies fever or chills. Pumping well VSS ABD- soft , ND, NTTP EXT - no Homans  Wbc- 18.9 ( from 13.7)  A/P: POD # 6; HD#1 with postpartum fever likely due to UTI         Stopped unasyn and started on iv cipro and flagyl - switch to oral if tolerates well         Will recheck cbc tonight          Once afebrile 24hrs with improving wbc will discharge to home

## 2016-01-13 LAB — CBC
HEMATOCRIT: 25.2 % — AB (ref 36.0–46.0)
Hemoglobin: 8.3 g/dL — ABNORMAL LOW (ref 12.0–15.0)
MCH: 29.5 pg (ref 26.0–34.0)
MCHC: 32.9 g/dL (ref 30.0–36.0)
MCV: 89.7 fL (ref 78.0–100.0)
Platelets: 354 10*3/uL (ref 150–400)
RBC: 2.81 MIL/uL — ABNORMAL LOW (ref 3.87–5.11)
RDW: 13.8 % (ref 11.5–15.5)
WBC: 17.3 10*3/uL — ABNORMAL HIGH (ref 4.0–10.5)

## 2016-01-13 LAB — URINE CULTURE

## 2016-01-13 NOTE — Consult Note (Signed)
Lactation note: Baby is currently latched to right breast and feeding actively.  Mom is also pumping.  She states she is not obtaining much from left breast.  Pink, warm, and slightly firm area noted on outer aspect of left breast.  Instructed to get in the shower and allow warm water to run over the area along with massage.  Recommend she repeat and pump every 2 hours.  She states the milk looked thick the last time she pumped.  Suggested she put baby to breast more on left to help drain breast more effectively.  Will follow up.

## 2016-01-13 NOTE — Progress Notes (Signed)
HD #3 postpartum fever s/p c-section (POD #10)  Subjective: Patient reports feeling improved.  No significant pain, comfortable  Objective: I have reviewed patient's vital signs, intake and output, medications and labs.  General: alert and cooperative Abdomen soft, NT  WBC decreased to 17.3 Tmax 103.1 yesterday at 1500  Tnow 98.7   Assessment/Plan: Pt with postpartum fever of ? Etiology.  Was slow to respond to antibiotics. (on cipro and flagyl now) Urine cx pending--possible source vs septic pelvic thrombophlebitis, Dr. Mindi SlickerBanga added lovenox las tPM Clinically improving so will continue current regimen while await urine cx  LOS: 2 days    Lauren Ross W 01/13/2016, 8:07 AM

## 2016-01-14 LAB — CBC
HCT: 28.3 % — ABNORMAL LOW (ref 36.0–46.0)
Hemoglobin: 9.4 g/dL — ABNORMAL LOW (ref 12.0–15.0)
MCH: 29.7 pg (ref 26.0–34.0)
MCHC: 33.2 g/dL (ref 30.0–36.0)
MCV: 89.3 fL (ref 78.0–100.0)
PLATELETS: 473 10*3/uL — AB (ref 150–400)
RBC: 3.17 MIL/uL — AB (ref 3.87–5.11)
RDW: 13.7 % (ref 11.5–15.5)
WBC: 13.4 10*3/uL — AB (ref 4.0–10.5)

## 2016-01-14 MED ORDER — ENOXAPARIN (LOVENOX) PATIENT EDUCATION KIT: 1.0000 | PACK | Freq: Once | 0 refills | Status: AC

## 2016-01-14 MED ORDER — CIPROFLOXACIN HCL 500 MG PO TABS: 500.0000 mg | ORAL_TABLET | Freq: Two times a day (BID) | ORAL | 0 refills | Status: DC

## 2016-01-14 MED ORDER — METRONIDAZOLE 500 MG PO TABS: 500.0000 mg | ORAL_TABLET | Freq: Two times a day (BID) | ORAL | 0 refills | Status: DC

## 2016-01-14 MED ORDER — ENOXAPARIN (LOVENOX) PATIENT EDUCATION KIT
PACK | Freq: Once | Status: DC
  Filled 2016-01-14: qty 1

## 2016-01-14 MED ORDER — ENOXAPARIN SODIUM 100 MG/ML ~~LOC~~ SOLN: 100.0000 mg | Freq: Two times a day (BID) | SUBCUTANEOUS | 0 refills | Status: AC

## 2016-01-14 NOTE — Discharge Summary (Signed)
Physician Discharge Summary  Patient ID: Lauren Ross MRN: [id number] DOB/AGE: December 12, 1990 25 y.o.  Admit date: 01/11/2016 Discharge date: 01/14/2016  Admission Diagnoses:  Discharge Diagnoses:  Active Problems:   Postpartum fever   Discharged Condition: good  Hospital Course: Pt admitted with postpartum fever to 103 s/p c-section 5 days prior to admission. Urine suspicious for pyelonephritis with TNTC WBC on clean catch, but culture had multiple species present and was not definitive.  She was initially started on Unasyn but quickly switched to cipro and flagyl IV and received 3 days of that.  Because she was slow to respond initially and respiked to 103 on HD #2, there was suspicion for septic pelvic thrombophlebitis and she was started on lovenox 100mg  po BID as well.  By HD #3 her temperature and WBC began to improve, and on HD #4 she was afebrile x 24 hours and WBC down to 13K from a high of 25K.      Significant Diagnostic Studies: labs and culture  Treatments: cipro, flagyl and lovenox  Discharge Exam: Blood pressure 99/65, pulse 84, temperature 98.3 F (36.8 C), temperature source Oral, resp. rate 16, height 5\' 2"  (1.575 m), weight 92.3 kg (203 lb 8 oz), SpO2 98 %, unknown if currently breastfeeding. General appearance: alert and cooperative Abdomen soft NT  Disposition: 01-Home or Self Care  Discharge Instructions    Call MD for:  persistant nausea and vomiting    Complete by:  As directed    Call MD for:  temperature >100.4    Complete by:  As directed    Diet - low sodium heart healthy    Complete by:  As directed    Discharge instructions    Complete by:  As directed    Lovenox shots twice daily for 2 weeks, finish antibiotics       Medication List    STOP taking these medications   acetaminophen 500 MG tablet Commonly known as:  TYLENOL     TAKE these medications   calcium carbonate 500 MG chewable tablet Commonly known as:  TUMS - dosed in mg  elemental calcium Chew 1-2 tablets by mouth as needed for indigestion or heartburn.   ciprofloxacin 500 MG tablet Commonly known as:  CIPRO Take 1 tablet (500 mg total) by mouth 2 (two) times daily.   enoxaparin 100 MG/ML injection Commonly known as:  LOVENOX Inject 1 mL (100 mg total) into the skin every 12 (twelve) hours.   escitalopram 10 MG tablet Commonly known as:  LEXAPRO Take 10 mg by mouth daily.   ibuprofen 600 MG tablet Commonly known as:  ADVIL,MOTRIN Take 1 tablet (600 mg total) by mouth every 6 (six) hours as needed for mild pain, moderate pain or cramping.   metroNIDAZOLE 500 MG tablet Commonly known as:  FLAGYL Take 1 tablet (500 mg total) by mouth 2 (two) times daily.   oxyCODONE-acetaminophen 5-325 MG tablet Commonly known as:  PERCOCET/ROXICET Take 1-2 tablets by mouth every 4 (four) hours as needed for severe pain.   prenatal multivitamin Tabs tablet Take 1 tablet by mouth daily.      Follow-up Information    [facility]ec[facility] Worema OronoBanga, DO. Schedule an appointment as soon as possible for a visit in 5 day(s).   Specialty:  Obstetrics and Gynecology Why:  [facility] Contact information: [address] [place] [address] [phone]           Signed: Oliver PilaRICHARDSON,[name] W 01/14/2016, 9:57 AM

## 2016-01-14 NOTE — Progress Notes (Signed)

## 2016-01-14 NOTE — Progress Notes (Signed)
Instructed patient on giving Lovenox injection.  Patient returned demonstration without difficulty.

## 2016-01-14 NOTE — Progress Notes (Signed)
Patient ID: Lauren SimmerChera Ross, female   DOB: 1990-09-26, 26 y.o.   MRN: [id number] 01/13/2016 8:07 AM           HD#4 postpartum fever s/p c-section (POD#11)  Feeling much better.  Denies pain or malaise.  afeb x 24 hours now Last temp 99.2 at 0115 01/13/16  Cipro/flagyl Day 3 Lovenox Day 2  WBC decreased to 13K  NAD Abdomen soft NT, incision clear  Pt clinically improved with resolved pp fever;  Not completely clear etiology.  Urine on admission had TNTC WBC, but was clean catch and unfortunately culture had multiple species present and was not definitive.  D/w pt urine likely source, but since cannot r/o SPT will continue lovenox for 2 weeks, she is agreeable to this.  Has given lovenox before in nursing scholl and will have her demonstrate prior to d/c. Continue cipro and flagyl x 10 days

## 2016-01-31 ENCOUNTER — Ambulatory Visit: Payer: Self-pay

## 2016-01-31 NOTE — Lactation Note (Signed)
This note was copied from a baby's chart. Lactation Consultation Note 274 week old baby Failure to Thrive. Mother's history included GDM, PCOS, depression.  Birth weight 7 lbs. 15 oz. Weight today 10/17 8 lb 2 oz. Baby had low BS in hospital after delivery and was supplemented with formula initially. Since discharge has been exclusively breastfeeding.   Spoke with mother.  States she has been primarily breastfeeding on one Rt breast per feeding.  Sometimes both breasts.  She pumps 2-3 times a day while breastfeeding on one breast and has been storing extra breastmilk instead of giving to baby.  Possible difficulty latching on L side.  She states she leaks all day on left side.  Is catching extra breastmilk in shell on L side.  She is pumping with Med[employer]  Pump in Style.  Mother states she is pumping approx. 1.5-2 oz.  Mother states baby has been spitty.  Suggest holding baby upright for 15 min after feeding and burping. [facility] and suggest pre and post weight at next feeding.  Confirmed feeding plan to RN. Spoke with mother and recommend post pumping after every feeding both breasts and giving volume back to baby at next feeding after breastfeeding session .  She may skip one pumping session during the night because mother has brought in approx. 4 oz of pumped breastmilk.  Encouraged mother to feed additional volume pumped back to baby.  Baby should be getting 3-4 oz.  per feeding.  Mother agreeable to feeding plan.  Lactation Consultant to see mother in hospital on 10/18,  Patient Name: Lauren RichtersBenson T Ross MVHQI'OToday's Date: 01/31/2016     Maternal Data    Feeding    LATCH Score/Interventions                      Lactation Tools Discussed/Used     Consult Status      Dahlia ByesBerkelhammer, [name] 01/31/2016, 6:07 PM

## 2016-02-01 ENCOUNTER — Ambulatory Visit: Payer: Self-pay

## 2016-02-01 NOTE — Lactation Note (Signed)
This note was copied from a baby's chart. Lactation Consultation Note  Patient Name: Lauren Ross ZOXWR'UToday's Date: 02/01/2016 Reason for consult: Follow-up assessment    With this mom and 284 week old baby, diagnosed with FTT, and [facility]. On exam of baby's mouth, he has what appears to be a short frenulum about 3 mm behind tip of tongue,  Imbedded in thick tissue, that causes his tongue  to form a bowl shape with elevation. Mom states baby "clamps down" with latch, and she has severe nipple damage on her left nipple from this. Mom applying EBM and using sore nipple shells. I fitted mom with 20 nipple shield, and showed mom how to apply and care for. The baby latched to shield, fussy for a few minutes, and then latched deeply, good breast movement, and frequent audible swallows. He then fed with nipple shield on the left breast, and also fed well. I interrupted this feed to offer  EBM by bottle with. He took an additional 75 ml's from bottle. Marland Kitchen. He tolerated all this well, except for 1 small wet burp. 'I made mom an o/p appointment for next Tues, 10/24 at 1430. Mom is to breast feed with cues, limit time at breast to 30 minutes, unless baby is actively suckling and swallowing, and then offer EBM by bottle as supplement, starting with 60 ml's. Mom to  Pump every 3 hours or so, after breast feeding, to increase her supply and provide EBm as supplement to baby. Mom states she feels better about breast feeding and the plan  to feed the baby. Mom knows to call lactation as needed.    Maternal Data    Feeding Feeding Type: Breast Fed Length of feed: 45 min  LATCH Score/Interventions Latch: Repeated attempts needed to sustain latch, nipple held in mouth throughout feeding, stimulation needed to elicit sucking reflex.  Audible Swallowing: Spontaneous and intermittent  Type of Nipple: Everted at rest and after stimulation  Comfort (Breast/Nipple): Filling, red/small blisters or  bruises, mild/mod discomfort (left nipple severely excooriated at tip - appears tip is gone)  Problem noted: Severe discomfort  Hold (Positioning): Assistance needed to correctly position infant at breast and maintain latch. Intervention(s): Breastfeeding basics reviewed;Support Pillows;Position options  LATCH Score: 7  Lactation Tools Discussed/Used Tools: Nipple Shields Nipple shield size: 20 Pump Review: Setup, frequency, and cleaning;Milk Storage Initiated by:: bedside RN   Consult Status Consult Status: Follow-up Date: 02/07/16 (at 1430) Follow-up type: Out-patient    Lauren LevinsLee, [name] Ross 02/01/2016, 12:08 PM

## 2016-02-07 ENCOUNTER — Ambulatory Visit (HOSPITAL_COMMUNITY)
Admission: RE | Admit: 2016-02-07 | Discharge: 2016-02-07 | Disposition: A | Discharge status: Home / Self Care | Payer: Managed Care, Other (non HMO) | Source: Ambulatory Visit | Attending: Obstetrics and Gynecology | Admitting: Obstetrics and Gynecology

## 2016-02-07 NOTE — Lactation Note (Signed)
Lactation Consult  Mother's reason for visit:  Follow-up after hospitalization of newborn for FTT. Patient Lauren Hick(Navia) also was hospitalized when newborn was 568 days old for fever. Baby has a small gape and has been biting and chewing at the breast.  Mother has nipple damage on the left nipple and it continually leaks from the wound. It is open but not bleeding at this time. Comfort gels given to mother to aid in healing. Mom also has a plugged duct on the left breast from 10:00 to 2:00.  Therapeutic breast massage was performed and though the area was less dense but the plug did not release from the breast. Milk supply is significantly lower on the affected side. Pt will continue to work on the breast with breast massage until it releases. Knows to call OB for signs symptoms of mastitis.  Pt does not have a much support at home and has decided to pump and bottle feed. Encouraged her to come to BF and mom support groups.  Follow-up as needed. Visit Type:  OP   Consult:  Initial Lactation Consultant:  Soyla DryerJoseph, Delora Gravatt  ________________________________________________________________________ Joan FloresBaby's Name:  Lauren Ross Barbar Date of Birth:  01/03/2017 Pediatrician:   Gender:  female Gestational Age: 3110w2d (At Birth) Birth Weight:  7 lb 15.7 oz (3620 g) Weight at Discharge:  Weight: 7 lb 9.9 oz (3455 g)               Date of Discharge:  01/06/2016      Filed Weights   01/03/16 2326 01/05/16 0041 01/06/16 0013  Weight: 7 lb 13.8 oz (3565 g) 7 lb 9 oz (3430 g) 7 lb 9.9 oz (3455 g)  [facility]  9# 5oz     Location:Pediatrician's office Weight today:  9# 8oz    ________________________________________________________________________  Mother's Name: Lauren Ross Type of delivery:  cesarean Breastfeeding Experience:  First baby Maternal Medical Conditions:  depression Maternal Medications:   Lexapro  ________________________________________________________________________  Breastfeeding History (Post Discharge)  Frequency of breastfeeding:  Currently pumping and bottle feeding Duration of feeding:  15    Infant Intake and Output Assessment  Voids:  8-12 in 24 hrs.  Color:  Clear yellow Stools:  4-5 in 24 hrs.  Color:  Yellow  ________________________________________________________________________

## 2016-11-02 ENCOUNTER — Encounter (HOSPITAL_COMMUNITY): Payer: Self-pay | Admitting: Emergency Medicine

## 2016-11-02 DIAGNOSIS — G8929 Other chronic pain: Secondary | ICD-10-CM | POA: Insufficient documentation

## 2016-11-02 DIAGNOSIS — M25511 Pain in right shoulder: Secondary | ICD-10-CM | POA: Insufficient documentation

## 2016-11-02 DIAGNOSIS — Z79899 Other long term (current) drug therapy: Secondary | ICD-10-CM | POA: Insufficient documentation

## 2016-11-02 NOTE — ED Triage Notes (Signed)
Patient reports persistent pain at right shoulder for several days , denies injury , pt. stated history of tendinitis , received Cortizone injection last Monday , pain increases with movement /palpation .

## 2016-11-03 ENCOUNTER — Emergency Department (HOSPITAL_COMMUNITY)
Admission: EM | Admit: 2016-11-03 | Discharge: 2016-11-03 | Disposition: A | Discharge status: Home / Self Care | Payer: Managed Care, Other (non HMO) | Attending: Emergency Medicine | Admitting: Emergency Medicine

## 2016-11-03 DIAGNOSIS — M25511 Pain in right shoulder: Secondary | ICD-10-CM

## 2016-11-03 DIAGNOSIS — G8929 Other chronic pain: Secondary | ICD-10-CM

## 2016-11-03 MED ORDER — IBUPROFEN 800 MG PO TABS: 800.0000 mg | ORAL_TABLET | Freq: Three times a day (TID) | ORAL | 0 refills | Status: AC

## 2016-11-03 MED ORDER — CYCLOBENZAPRINE HCL 10 MG PO TABS: 10.0000 mg | ORAL_TABLET | Freq: Two times a day (BID) | ORAL | 0 refills | Status: DC | PRN
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
## 2016-11-03 NOTE — ED Provider Notes (Signed)
[facility] Provider Note   CSN: [id number] Arrival date & time: 11/02/16  2238     History   Chief Complaint Chief Complaint  Patient presents with  . Shoulder Pain    Tendinitis    HPI Lauren Ross is a 26 y.o. female.  Patient presents to the emergency department with chief complaint right shoulder pain. 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 She was given a Cortizone injection. She reports that she has had no relief of her symptoms. States that she has some pain that radiates into her forearm and hand. She denies any fevers chills. She states that she nearly dropped her child today, and this prompted her to come to the emergency department for further evaluation to see if anything more immediate could be done to help with her symptoms.   The history is provided by the patient. No language interpreter was used.    Past Medical History:  Diagnosis Date  . Cholestasis of pregnancy in third trimester   . Depression   . Gestational diabetes    glyburide  . Hx of varicella   . Polycystic ovary syndrome   . Pregnant     Patient Active Problem List   Diagnosis Date Noted  . Postpartum fever 01/11/2016  . Arrest of dilation, delivered, current hospitalization 01/03/2016  . Cesarean delivery delivered 01/03/2016  . Postpartum care following cesarean delivery 01/03/2016  . Term pregnancy 01/02/2016  . Major depressive disorder, recurrent, severe without psychotic features (HCC)   . MDD (major depressive disorder), recurrent episode, severe (HCC) 07/28/2014  . MDD (major depressive disorder), recurrent episode, severe (HCC) 07/28/2014    Past Surgical History:  Procedure Laterality Date  . CESAREAN SECTION N/A 01/03/2016   Procedure: CESAREAN SECTION;  Surgeon: Edwinna Areola, DO;  Location: [facility];  Service: Obstetrics;  Laterality: N/A;  . CHOLECYSTECTOMY    . KIDNEY STONE  SURGERY    . WISDOM TOOTH EXTRACTION      OB History    Gravida Para Term Preterm AB Living   1 1 1     1    SAB TAB Ectopic Multiple Live Births         0 1       Home Medications    Prior to Admission medications   Medication Sig Start Date End Date Taking? Authorizing Provider  calcium carbonate (TUMS - DOSED IN MG ELEMENTAL CALCIUM) 500 MG chewable tablet Chew 1-2 tablets by mouth as needed for indigestion or heartburn.     [provider]  ciprofloxacin (CIPRO) 500 MG tablet Take 1 tablet (500 mg total) by mouth 2 (two) times daily. 01/14/16   Huel Cote, MD  enoxaparin (LOVENOX) 100 MG/ML injection Inject 1 mL (100 mg total) into the skin every 12 (twelve) hours. 01/14/16 01/28/16  Huel Cote, MD  escitalopram (LEXAPRO) 10 MG tablet Take 10 mg by mouth daily.    [provider]  ibuprofen (ADVIL,MOTRIN) 600 MG tablet Take 1 tablet (600 mg total) by mouth every 6 (six) hours as needed for mild pain, moderate pain or cramping. 01/06/16   Banga, Sharol Given, DO  metroNIDAZOLE (FLAGYL) 500 MG tablet Take 1 tablet (500 mg total) by mouth 2 (two) times daily. 01/14/16   Huel Cote, MD  oxyCODONE-acetaminophen (PERCOCET/ROXICET) 5-325 MG tablet Take 1-2 tablets by mouth every 4 (four) hours as needed for severe pain.    [provider]  Prenatal Vit-Fe  Fumarate-FA (PRENATAL MULTIVITAMIN) TABS tablet Take 1 tablet by mouth daily.     [provider]    Family History No family history on file.  Social History Social History  Substance Use Topics  . Smoking status: Never Smoker  . Smokeless tobacco: Never Used  . Alcohol use Yes     Allergies   Bee venom   Review of Systems Review of Systems  All other systems reviewed and are negative.    Physical Exam Updated Vital Signs BP (!) 145/85 (BP Location: Left Arm)   Pulse 94   Temp 98.2 F (36.8 C) (Oral)   Resp 20   LMP 10/23/2016 (Approximate)   SpO2 97%    Physical Exam Nursing note and vitals reviewed.  Constitutional: Pt appears well-developed and well-nourished. No distress.  HENT:  Head: Normocephalic and atraumatic.  Eyes: Conjunctivae are normal.  Neck: Normal range of motion.  Cardiovascular: Normal rate, regular rhythm. Intact distal pulses.   Capillary refill < 3 sec.  Pulmonary/Chest: Effort normal and breath sounds normal.  Musculoskeletal:  Right shoulder Pt exhibits ttp anteriorly, no visible swelling, no bony abnormality or deformity.   ROM: 3/5 limited by pain  Strength: 3/5 limited by pain  Neurological: Pt  is alert. Coordination normal.  Sensation: 5/5 Skin: Skin is warm and dry. Pt is not diaphoretic.  No evidence of open wound or skin tenting No red skin, no sign of infection or abscess Psychiatric: Pt has a normal mood and affect.     ED Treatments / Results  Labs (all labs ordered are listed, but only abnormal results are displayed) Labs Reviewed - No data to display  EKG  EKG Interpretation None       Radiology No results found.  Procedures Procedures (including critical care time)  Medications Ordered in ED Medications - No data to display   Initial Impression / Assessment and Plan / ED Course  I have reviewed the triage vital signs and the nursing notes.  Pertinent labs & imaging results that were available during my care of the patient were reviewed by me and considered in my medical decision making (see chart for details).     Patient with recent diagnosis of calcific tendonitis.  Followed by ortho.  Is starting PT.  Pt advised to follow up with orthopedics. Requesting better pain management. Patient will be discharged home & is agreeable with above plan. Returns precautions discussed. Pt appears safe for discharge.   Final Clinical Impressions(s) / ED Diagnoses   Final diagnoses:  Chronic right shoulder pain    New Prescriptions New Prescriptions   CYCLOBENZAPRINE  (FLEXERIL) 10 MG TABLET    Take 1 tablet (10 mg total) by mouth 2 (two) times daily as needed for muscle spasms.   IBUPROFEN (ADVIL,MOTRIN) 800 MG TABLET    Take 1 tablet (800 mg total) by mouth 3 (three) times daily.     Roxy HorsemanBrowning, Jazman Reuter, PA-C 11/03/16 0217    Zadie RhineWickline, Donald, MD 11/04/16 [phone]

## 2017-06-11 ENCOUNTER — Encounter (HOSPITAL_COMMUNITY): Payer: Self-pay | Admitting: Emergency Medicine

## 2017-06-11 ENCOUNTER — Ambulatory Visit (HOSPITAL_COMMUNITY)
Admission: EM | Admit: 2017-06-11 | Discharge: 2017-06-11 | Disposition: A | Discharge status: Home / Self Care | Payer: Self-pay | Attending: Family Medicine | Admitting: Family Medicine

## 2017-06-11 DIAGNOSIS — J020 Streptococcal pharyngitis: Secondary | ICD-10-CM

## 2017-06-11 LAB — POCT RAPID STREP A: Streptococcus, Group A Screen (Direct): POSITIVE — AB

## 2017-06-11 MED ORDER — IBUPROFEN 800 MG PO TABS
800.0000 mg | ORAL_TABLET | Freq: Once | ORAL | Status: AC
  Administered 2017-06-11: 800 mg via ORAL

## 2017-06-11 MED ORDER — IBUPROFEN 800 MG PO TABS
ORAL_TABLET | ORAL | Status: AC
  Filled 2017-06-11: qty 1

## 2017-06-11 MED ORDER — AMOXICILLIN 875 MG PO TABS: 875.0000 mg | ORAL_TABLET | Freq: Two times a day (BID) | ORAL | 0 refills | Status: DC

## 2017-06-11 MED ORDER — ACETAMINOPHEN 325 MG PO TABS: 650.0000 mg | ORAL_TABLET | Freq: Once | ORAL | Status: DC

## 2017-06-11 NOTE — ED Triage Notes (Signed)
PT reports sore throat, swollen throat, nausea, headache, fever that started last night.  No airway involvement.

## 2017-06-11 NOTE — ED Provider Notes (Signed)
  [facility]   [id number] 06/11/17 Arrival Time: 1302  ASSESSMENT & PLAN:  1. Strep pharyngitis     Meds ordered this encounter  Medications  . DISCONTD: acetaminophen (TYLENOL) tablet 650 mg  . ibuprofen (ADVIL,MOTRIN) tablet 800 mg  . amoxicillin (AMOXIL) 875 MG tablet    Sig: Take 1 tablet (875 mg total) by mouth 2 (two) times daily for 10 days.    Dispense:  20 tablet    Refill:  0    Labs Reviewed  POCT RAPID STREP A - Abnormal; Notable for the following components:      Result Value   Streptococcus, Group A Screen (Direct) POSITIVE (*)    All other components within normal limits   OTC analgesics and throat care as needed  Instructed to finish full 10 day course of antibiotics. Will follow up if not showing significant improvement over the next 24-48 hours.  Reviewed expectations re: course of current medical issues. Questions answered. Outlined signs and symptoms indicating need for more acute intervention. Patient verbalized understanding. After Visit Summary given.   SUBJECTIVE:  Lauren SimmerChera Ross is a 27 y.o. female who reports a sore throat. Describes as pain with swallowing. Onset abrupt beginning 1 day ago. No respiratory symptoms. Normal PO intake but reports discomfort with swallowing. Fever reported: yes, subjective. No associated n/v/abdominal symptoms. Sick contacts: none.  OTC treatment: ibuprofen with mild help.  ROS: As per HPI.   OBJECTIVE:  Vitals:   06/11/17 1355 06/11/17 1356  BP:  111/78  Pulse:  (!) 135  Resp:  18  Temp:  (!) 102.5 F (39.2 C)  TempSrc:  Oral  SpO2:  96%  Weight: 220 lb (99.8 kg)      General appearance: alert; no distress HEENT: throat with tonsillar hypertrophy, moderate erythema and exudates present; uvula midline Neck: supple with FROM; bilateral cervical LAD, tender Lungs: clear to auscultation bilaterally Skin: reveals no rash; warm and dry Psychological: alert and cooperative; normal mood and  affect  Allergies  Allergen Reactions  . Bee Venom Hives and Swelling    Past Medical History:  Diagnosis Date  . Cholestasis of pregnancy in third trimester   . Depression   . Gestational diabetes    glyburide  . Hx of varicella   . Polycystic ovary syndrome   . Pregnant    Social History   Socioeconomic History  . Marital status: Divorced    Spouse name: Not on file  . Number of children: Not on file  . Years of education: Not on file  . Highest education level: Not on file  Social Needs  . Financial resource strain: Not on file  . Food insecurity - worry: Not on file  . Food insecurity - inability: Not on file  . Transportation needs - medical: Not on file  . Transportation needs - non-medical: Not on file  Occupational History  . Not on file  Tobacco Use  . Smoking status: Never Smoker  . Smokeless tobacco: Never Used  Substance and Sexual Activity  . Alcohol use: Yes  . Drug use: Not on file  . Sexual activity: Yes  Other Topics Concern  . Not on file  Social History Narrative  . Not on file   No family history on file.        Mardella LaymanHagler, Holly Pring, MD 06/11/17 1446

## 2017-06-14 ENCOUNTER — Ambulatory Visit (HOSPITAL_COMMUNITY)
Admission: EM | Admit: 2017-06-14 | Discharge: 2017-06-14 | Disposition: A | Discharge status: Home / Self Care | Payer: Self-pay | Attending: Emergency Medicine | Admitting: Emergency Medicine

## 2017-06-14 ENCOUNTER — Other Ambulatory Visit: Payer: Self-pay

## 2017-06-14 ENCOUNTER — Encounter (HOSPITAL_COMMUNITY): Payer: Self-pay | Admitting: Emergency Medicine

## 2017-06-14 DIAGNOSIS — J020 Streptococcal pharyngitis: Secondary | ICD-10-CM

## 2017-06-14 LAB — POCT INFECTIOUS MONO SCREEN: Mono Screen: NEGATIVE

## 2017-06-14 MED ORDER — AMOXICILLIN-POT CLAVULANATE 875-125 MG PO TABS: 1.0000 | ORAL_TABLET | Freq: Two times a day (BID) | ORAL | 0 refills | Status: AC

## 2017-06-14 NOTE — ED Triage Notes (Signed)
Pt tested positive for Strep on 06/11/17.  She is taking Amoxicillin and Ibuprofen for her symptoms. She states the throat is feeling a little better, but her lymph nodes have been swollen and she states she still has a fever and she feels like she was "run over by a truck".  Pt does not remember what her fever was, but she states her fever was 100.9 last night.

## 2017-06-14 NOTE — Discharge Instructions (Signed)
Your Monospot was negative.  We are going to change to Augmentin to broaden your antibiotic coverage. stop the amoxicillin.  800 mg of ibuprofen with 1 g of Tylenol 3 times a day.  Take this together.  Follow-up with a primary care physician of your choice.  See list below  Below is a list of primary care practices who are taking new patients for you to follow-up with. [facility] [address] [place], [place] [address] [phone]  [facility] [phone] [address] [place] [address]  [facility]: [address] [place] [id number]  [phone]  [facility]: [address] [place] [address]   [phone]  [facility]: [address] [place] [address]  [phone]  [facility] : [address] [place] [address] [phone]  [facility]: [address]  [place] [phone]  [facility]: [address]  [place] [address] [phone]  Dr. Oneal GroutMahima Pandey [address] [facility] [facility] [place] [address]  [phone]  Dr. Jackie PlumGeorge Osei-Bonsu, [facility]. [address] [place], [place] [address]  [phone]  Go to www.goodrx.com to look up your medications. This will give you a list of where you can find your prescriptions at the most affordable prices. Or ask the pharmacist what the cash price is, or if they have any other discount programs available to help make your medication more affordable. This can be less expensive than what you would pay with insurance.

## 2017-06-14 NOTE — ED Provider Notes (Signed)
HPI  SUBJECTIVE:  Patient returns to clinic today for 4 days of a sore throat. Seen here 3 days ago, found to have strep pharyngitis, rapid strep positive, sent home on amoxicillin, ibuprofen.  She is on day #3 of the amoxicillin.  She reports persistent fevers T-max 100.9, and states that she "feels like she has been hit by a truck".  She reports swollen cervical lymphadenopathy, body aches.  She has also been taking ibuprofen in addition to the amoxicillin.  The ibuprofen helps.  Symptoms are worse with swallowing.  She denies  rash, headache, abdominal pain, drooling, trismus, voice changes, difficulty breathing, sensation of throat swelling shut.  She states her throat feels slightly better today.  No contacts with mono.  She has a past medical history of gestational diabetes.  No history of mono, hypertension.  LMP: 2/14.  Denies the possibility of being pregnant.  PMD: None.  Past Medical History:  Diagnosis Date  . Cholestasis of pregnancy in third trimester   . Depression   . Gestational diabetes    glyburide  . Hx of varicella   . Polycystic ovary syndrome   . Pregnant     Past Surgical History:  Procedure Laterality Date  . CESAREAN SECTION N/A 01/03/2016   Procedure: CESAREAN SECTION;  Surgeon: Edwinna Areolaecilia Worema Banga, DO;  Location: [facility];  Service: Obstetrics;  Laterality: N/A;  . CHOLECYSTECTOMY    . KIDNEY STONE SURGERY    . WISDOM TOOTH EXTRACTION      History reviewed. No pertinent family history.  Social History   Tobacco Use  . Smoking status: Never Smoker  . Smokeless tobacco: Never Used  Substance Use Topics  . Alcohol use: Yes  . Drug use: Not on file    No current facility-administered medications for this encounter.   Current Outpatient Medications:  .  ibuprofen (ADVIL,MOTRIN) 800 MG tablet, Take 1 tablet (800 mg total) by mouth 3 (three) times daily., Disp: 21 tablet, Rfl: 0 .  amoxicillin-clavulanate (AUGMENTIN) 875-125 MG tablet, Take 1  tablet by mouth 2 (two) times daily for 7 days., Disp: 14 tablet, Rfl: 0 .  calcium carbonate (TUMS - DOSED IN MG ELEMENTAL CALCIUM) 500 MG chewable tablet, Chew 1-2 tablets by mouth as needed for indigestion or heartburn. , Disp: , Rfl:  .  enoxaparin (LOVENOX) 100 MG/ML injection, Inject 1 mL (100 mg total) into the skin every 12 (twelve) hours., Disp: 28 Syringe, Rfl: 0 .  escitalopram (LEXAPRO) 10 MG tablet, Take 10 mg by mouth daily., Disp: , Rfl:   Allergies  Allergen Reactions  . Bee Venom Hives and Swelling     ROS  As noted in HPI.   Physical Exam  BP 96/68 (BP Location: Left Arm)   Pulse 93   Temp 98.3 F (36.8 C) (Oral)   SpO2 100%   Constitutional: Well developed, well nourished, no acute distress Eyes:  EOMI, conjunctiva normal bilaterally HENT: Normocephalic, atraumatic,mucus membranes moist erythematous oropharynx with swollen tonsils with extensive exudates.  Uvula midline.  No soft palate swelling. Neck: Positive cervical lymphadenopathy Respiratory: Normal inspiratory effort Cardiovascular: Normal rate, no murmurs GI: nondistended.  Mild left upper quadrant tenderness, no splenomegaly. skin: No rash, skin intact Musculoskeletal: no deformities Neurologic: Alert & oriented x 3, no focal neuro deficits Psychiatric: Speech and behavior appropriate   ED Course   Medications - No data to display  Orders Placed This Encounter  Procedures  . Infectious mono screen, POC    Standing Status:  Standing    Number of Occurrences:   1    Results for orders placed or performed during the hospital encounter of 06/14/17 (from the past 24 hour(s))  Infectious mono screen, POC     Status: None   Collection Time: 06/14/17 12:10 PM  Result Value Ref Range   Mono Screen NEGATIVE NEGATIVE   No results found.  ED Clinical Impression  Strep pharyngitis   ED Assessment/Plan  Previous records reviewed as noted in HPI.  Will check a Monospot.  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  Patient declined injection of dexamethasone.  Monospot negative.  Plan as above.  No evidence of peritonsillar abscess, retropharyngeal abscess.   Discussed labs, MDM, plan and followup with patient. Discussed sn/sx that should prompt return to the ED. patient agrees with plan.   Meds ordered this encounter  Medications  . amoxicillin-clavulanate (AUGMENTIN) 875-125 MG tablet    Sig: Take 1 tablet by mouth 2 (two) times daily for 7 days.    Dispense:  14 tablet    Refill:  0    *This clinic note was created using Scientist, clinical (histocompatibility and immunogenetics). Therefore, there may be occasional mistakes despite careful proofreading.   ?   Domenick Gong, MD 06/14/17 [phone]

## 2017-08-24 IMAGING — CT CT ABD-PELV W/O CM
2 of 4 series · 17 of 46 positions shown, 19 images · non-contrast
Comparison: None.

CLINICAL DATA: Right flank pain, gross hematuria.

EXAM:
CT ABDOMEN AND PELVIS WITHOUT CONTRAST
TECHNIQUE: Multidetector CT imaging of the abdomen and pelvis was performed
following the standard protocol without IV contrast.

[Series 2: abd/ pelvis 5.0 i30f 1 · axial · 0.98mm/px · z∈[-525,-70]mm · 14 of 99 slices shown, 16 images]
[im 4/99  soft-tissue]
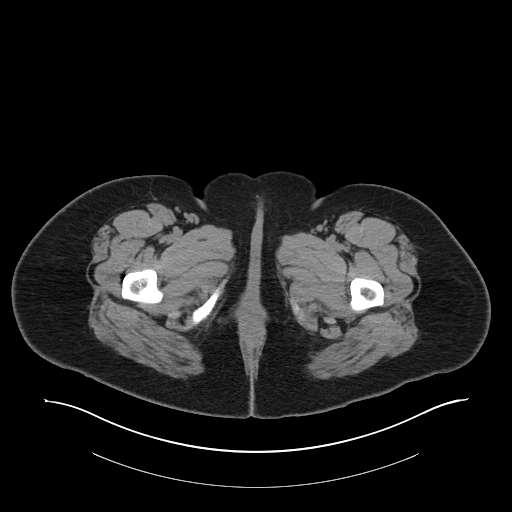
[im 4/99  bone]
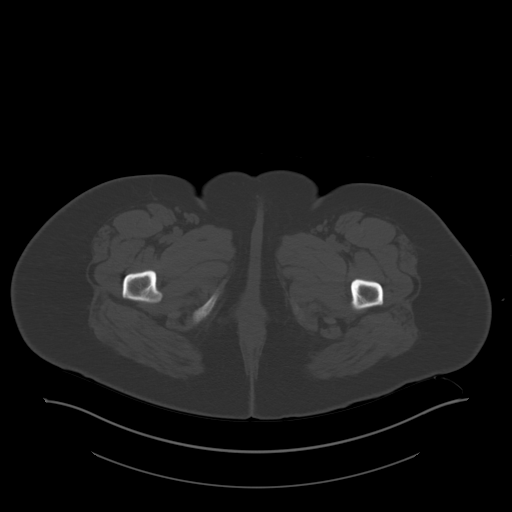
[im 12/99  soft-tissue]
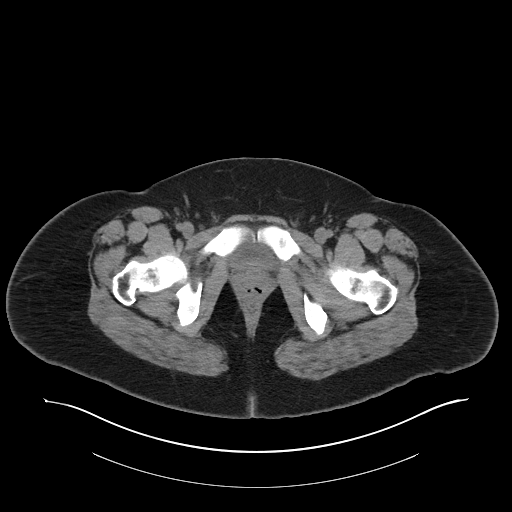
[im 20/99  soft-tissue]
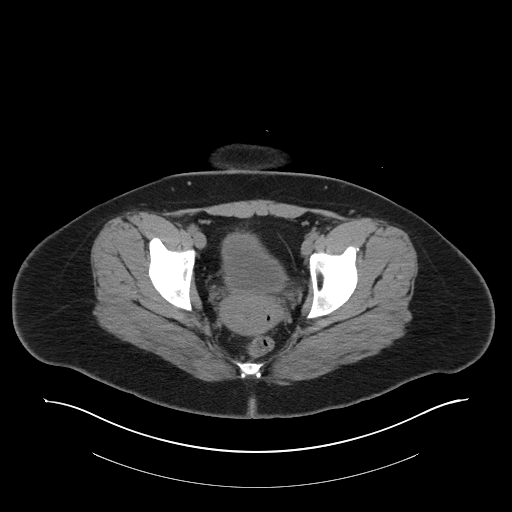
[im 28/99  soft-tissue]
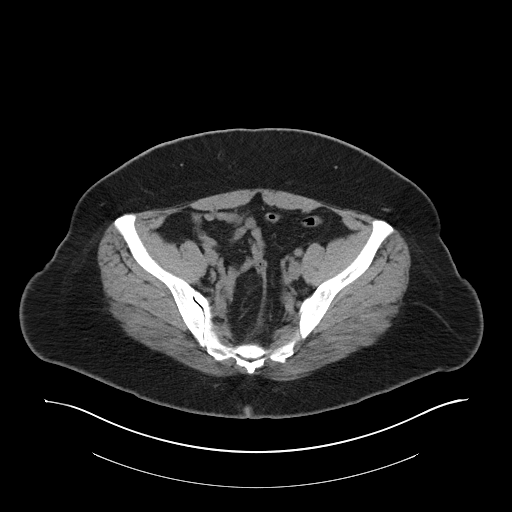
[im 32/99  soft-tissue]
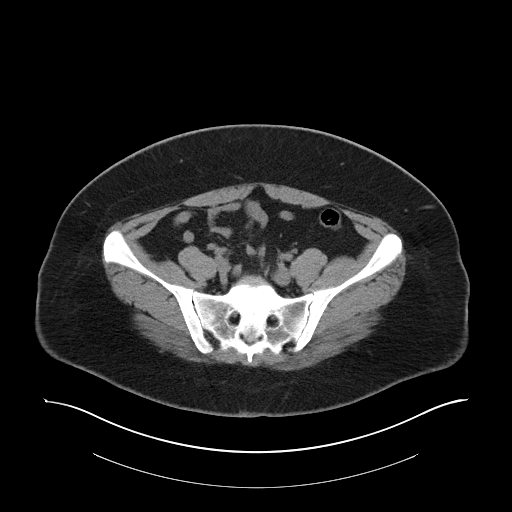
[im 40/99  soft-tissue]
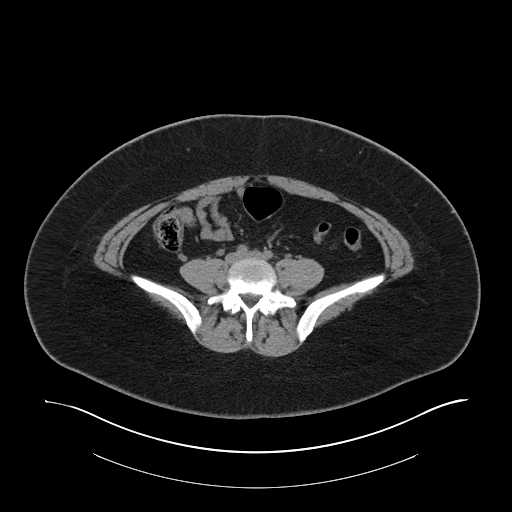
[im 48/99  soft-tissue]
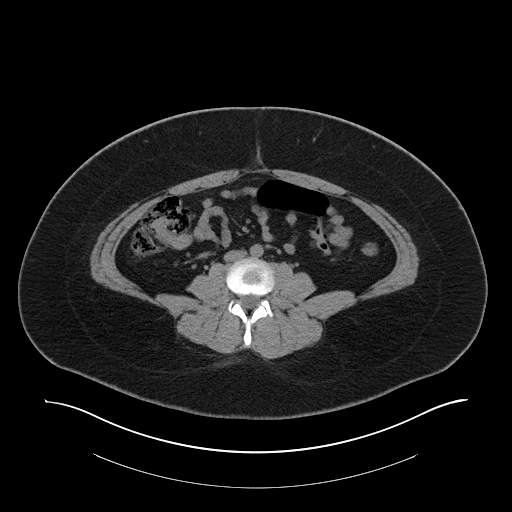
[im 51/99  soft-tissue]
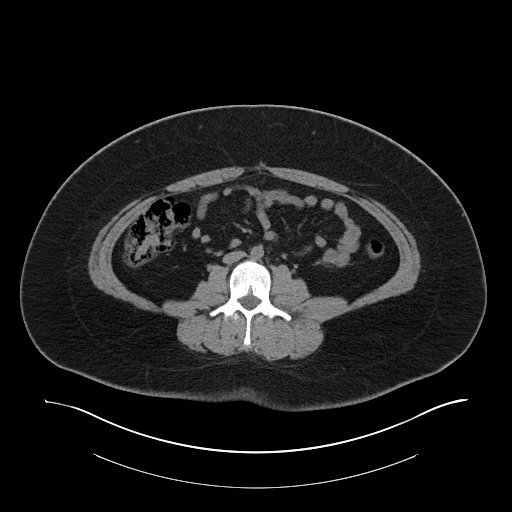
[im 59/99  soft-tissue]
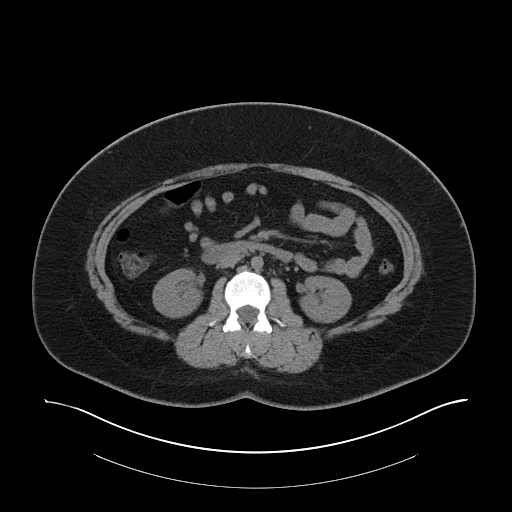
[im 59/99  bone]
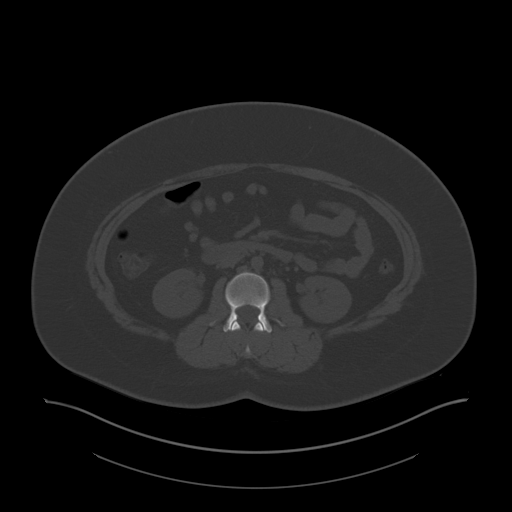
[im 67/99  soft-tissue]
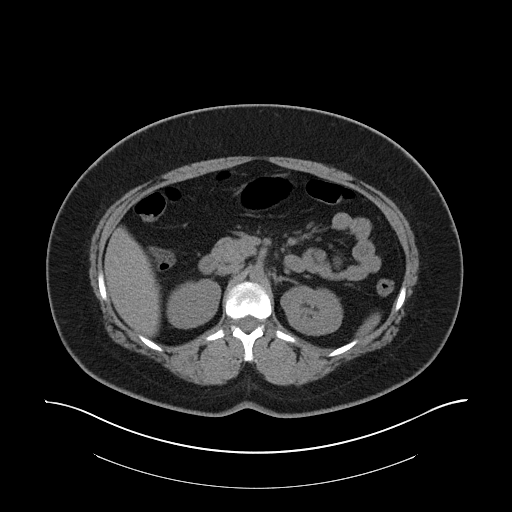
[im 75/99  soft-tissue]
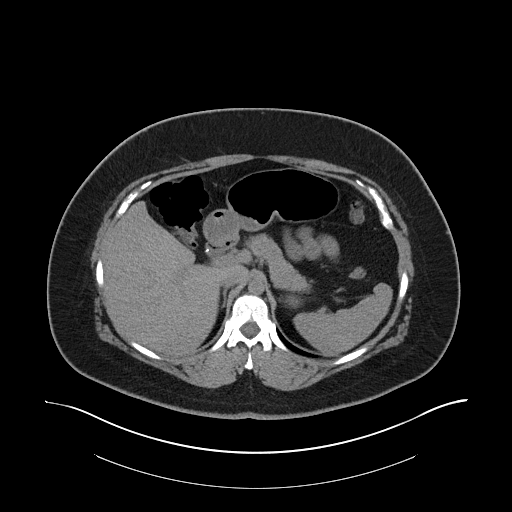
[im 79/99  soft-tissue]
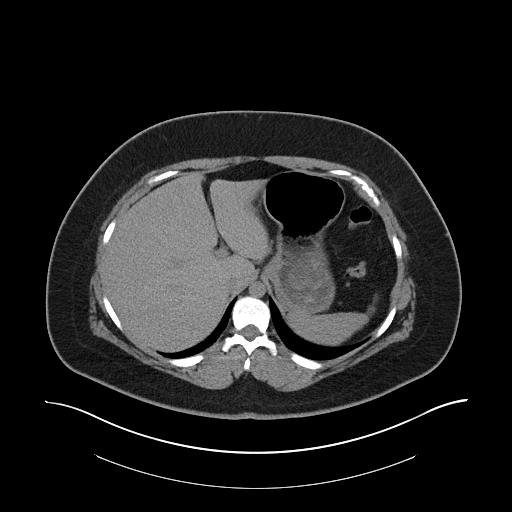
[im 87/99  soft-tissue]
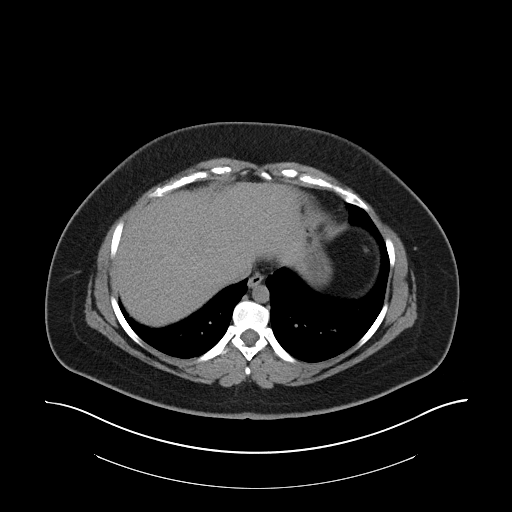
[im 95/99  soft-tissue]
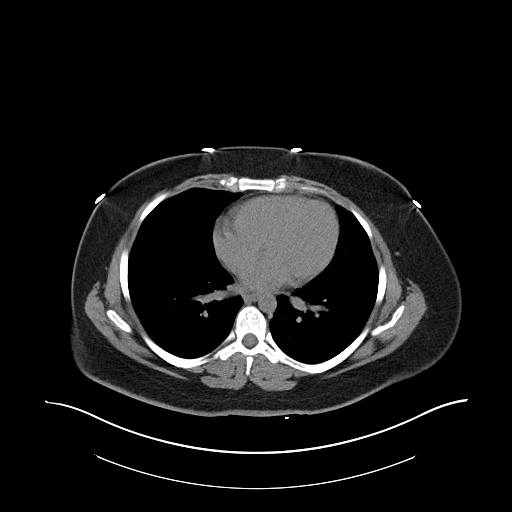

[Series 5: cor st · coronal · 0.93mm/px · 3 of 102 slices shown]
[im 34/102  soft-tissue]
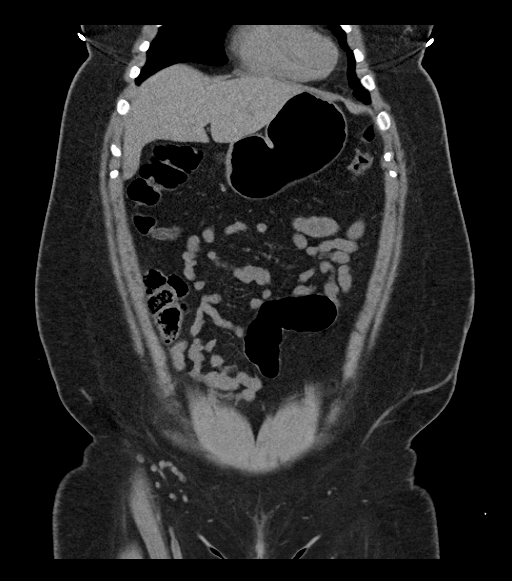
[im 45/102  soft-tissue]
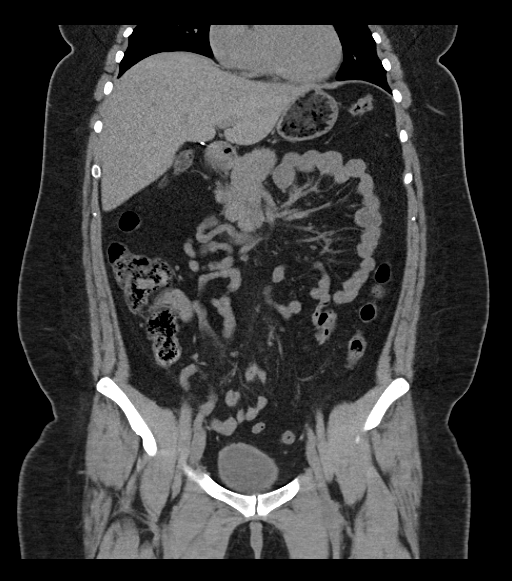
[im 57/102  soft-tissue]
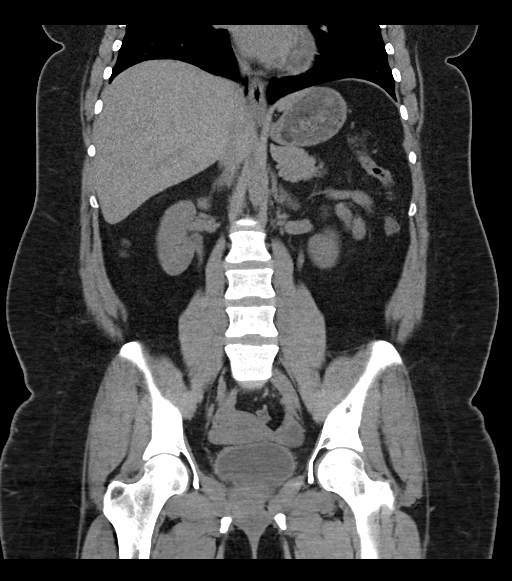

[17 of 46 positions shown; findings below may reference images not displayed]

FINDINGS: Visualized lung bases are unremarkable. No significant osseous
abnormality is noted.

Status post cholecystectomy. No focal abnormality is noted in the
liver, spleen or pancreas on these unenhanced images. Adrenal glands
and kidneys appear normal. No hydronephrosis or renal obstruction is
noted. The appendix appears normal. There is no evidence of bowel
obstruction. No abnormal fluid collection is noted. Urinary bladder
appears normal. Uterus and ovaries are unremarkable. No significant
adenopathy is noted.
IMPRESSION: Status post cholecystectomy. No hydronephrosis or renal obstruction
is noted. No renal or ureteral calculi are noted. No acute
abnormality is noted in the abdomen or pelvis.

## 2019-02-27 ENCOUNTER — Emergency Department (HOSPITAL_COMMUNITY): Payer: Medicaid - Out of State

## 2019-02-27 ENCOUNTER — Encounter (HOSPITAL_COMMUNITY): Payer: Self-pay

## 2019-02-27 ENCOUNTER — Other Ambulatory Visit: Payer: Self-pay

## 2019-02-27 ENCOUNTER — Emergency Department (HOSPITAL_COMMUNITY)
Admission: EM | Admit: 2019-02-27 | Discharge: 2019-02-28 | Disposition: A | Discharge status: Home / Self Care | Payer: Medicaid - Out of State | Attending: Emergency Medicine | Admitting: Emergency Medicine

## 2019-02-27 DIAGNOSIS — Z79899 Other long term (current) drug therapy: Secondary | ICD-10-CM | POA: Insufficient documentation

## 2019-02-27 DIAGNOSIS — R1013 Epigastric pain: Secondary | ICD-10-CM | POA: Diagnosis not present

## 2019-02-27 DIAGNOSIS — R079 Chest pain, unspecified: Secondary | ICD-10-CM | POA: Diagnosis present

## 2019-02-27 LAB — CBC
HCT: 43.4 % (ref 36.0–46.0)
Hemoglobin: 14.4 g/dL (ref 12.0–15.0)
MCH: 30.1 pg (ref 26.0–34.0)
MCHC: 33.2 g/dL (ref 30.0–36.0)
MCV: 90.6 fL (ref 80.0–100.0)
Platelets: 334 10*3/uL (ref 150–400)
RBC: 4.79 MIL/uL (ref 3.87–5.11)
RDW: 12.9 % (ref 11.5–15.5)
WBC: 11.1 10*3/uL — ABNORMAL HIGH (ref 4.0–10.5)
nRBC: 0 % (ref 0.0–0.2)

## 2019-02-27 LAB — BASIC METABOLIC PANEL
Anion gap: 10 (ref 5–15)
BUN: 11 mg/dL (ref 6–20)
CO2: 26 mmol/L (ref 22–32)
Calcium: 9 mg/dL (ref 8.9–10.3)
Chloride: 101 mmol/L (ref 98–111)
Creatinine, Ser: 0.65 mg/dL (ref 0.44–1.00)
GFR calc Af Amer: >60 mL/min (ref >60)
GFR calc non Af Amer: >60 mL/min (ref >60)
Glucose, Bld: 128 mg/dL — ABNORMAL HIGH (ref 70–99)
Potassium: 3.8 mmol/L (ref 3.5–5.1)
Sodium: 137 mmol/L (ref 135–145)

## 2019-02-27 LAB — I-STAT BETA HCG BLOOD, ED (MC, WL, AP ONLY): I-stat hCG, quantitative: <5 m[IU]/mL (ref <5)

## 2019-02-27 LAB — TROPONIN I (HIGH SENSITIVITY): Troponin I (High Sensitivity): <2 ng/L (ref <18)

## 2019-02-27 NOTE — ED Triage Notes (Signed)
Pt reports that she has been having central CP since yesterday, radiation to back, with dizziness and nausea

## 2019-02-28 LAB — HEPATIC FUNCTION PANEL
ALT: 49 U/L — ABNORMAL HIGH (ref 0–44)
AST: 28 U/L (ref 15–41)
Albumin: 3.6 g/dL (ref 3.5–5.0)
Alkaline Phosphatase: 95 U/L (ref 38–126)
Bilirubin, Direct: <0.1 mg/dL (ref 0.0–0.2)
Total Bilirubin: 0.9 mg/dL (ref 0.3–1.2)
Total Protein: 7.2 g/dL (ref 6.5–8.1)

## 2019-02-28 LAB — D-DIMER, QUANTITATIVE: D-Dimer, Quant: 0.27 ug/mL-FEU (ref 0.00–0.50)

## 2019-02-28 LAB — LIPASE, BLOOD: Lipase: 29 U/L (ref 11–51)

## 2019-02-28 MED ORDER — OMEPRAZOLE 20 MG PO CPDR: 20.0000 mg | DELAYED_RELEASE_CAPSULE | Freq: Every day | ORAL | 0 refills | Status: DC

## 2019-02-28 MED ORDER — ALUM & MAG HYDROXIDE-SIMETH 200-200-20 MG/5ML PO SUSP
30.0000 mL | Freq: Once | ORAL | Status: AC
Start: 2019-02-28 — End: 2019-02-28
  Administered 2019-02-28: 01:00:00 30 mL via ORAL
  Filled 2019-02-28: qty 30

## 2019-02-28 MED ORDER — LIDOCAINE VISCOUS HCL 2 % MT SOLN
15.0000 mL | Freq: Once | OROMUCOSAL | Status: AC
  Administered 2019-02-28: 15 mL via ORAL
  Filled 2019-02-28: qty 15

## 2019-02-28 NOTE — ED Notes (Signed)
RN Threasa Beards to draw tropinin

## 2019-02-28 NOTE — Discharge Instructions (Addendum)
Thank you for allowing me to care for you today in the Emergency Department.   Take 1 tablet of omeprazole daily for the next 2 weeks.  You can also try taking over-the-counter medications, such as MiraLAX, to see if this improves her symptoms.  You can follow up with gastroenterology if your symptoms do not improve with this regimen.   Make sure if you are taking NSAIDs, like ibuprofen or Aleve, for headaches that you always take them with food.   Return to the ER if you develop severe pain with a fever, black or bloody stools, if you become unable to swallow, develop respiratory distress, or other, new concerning symptoms.

## 2019-02-28 NOTE — ED Notes (Signed)
Discharge instructions discussed with pt. Pt verbalized understanding. Pt stable and ambulatory. No signature pad available. 

## 2019-02-28 NOTE — ED Provider Notes (Signed)
[facility] Provider Note   CSN: [id number] Arrival date & time: 02/27/19  2121     History   Chief Complaint Chief Complaint  Patient presents with  . Chest Pain    HPI Lauren Ross is a 28 y.o. female with a history of DVT, PCOS, and GERD who presents to the emergency department with a chief complaint of chest pain.  The patient reports she has been having intermittent chest pain since she awoke yesterday morning.  She characterizes the pain as squeezing in the center of her chest and reports that the pain will suddenly come on and resolve within 3 to 5 seconds.  She reports that the pain will intermittently radiate to her back between her shoulder blades. She has had countless episodes of pain since onset.  No known aggravating or alleviating factors, including eating.  She has had nausea and chills.  She also reports that she has had a couple of episodes of intermittent dizziness that began today.  No dizziness at this time.  One episode occurred while she was sitting at her desk at work.  States that she has a history of GERD, but the current symptoms feel different.  She reports that she did have a headache earlier today, but she has a history of chronic tension headaches.  She does report that she takes NSAIDs 2-3 times per week.  No change in the quality or severity of her headache.  She denies fever, cough, shortness of breath, leg swelling, vomiting, diarrhea, urinary complaints, melena, hematochezia, or palpitations.  No treatment prior to arrival.  No history of similar.  She does report that she was diagnosed with an unprovoked DVT a couple years ago and was on Xarelto for several months.  She reports a family history of VTE.  She is not currently on any form of birth control.  No recent long travel, surgery, or immobilization.  Surgical history includes cholecystectomy.     The history is provided by the patient. No language  interpreter was used.    Past Medical History:  Diagnosis Date  . Cholestasis of pregnancy in third trimester   . Depression   . Gestational diabetes    glyburide  . Hx of varicella   . Polycystic ovary syndrome   . Pregnant     Patient Active Problem List   Diagnosis Date Noted  . Postpartum fever 01/11/2016  . Arrest of dilation, delivered, current hospitalization 01/03/2016  . Cesarean delivery delivered 01/03/2016  . Postpartum care following cesarean delivery 01/03/2016  . Term pregnancy 01/02/2016  . Major depressive disorder, recurrent, severe without psychotic features (HCC)   . MDD (major depressive disorder), recurrent episode, severe (HCC) 07/28/2014  . MDD (major depressive disorder), recurrent episode, severe (HCC) 07/28/2014    Past Surgical History:  Procedure Laterality Date  . CESAREAN SECTION N/A 01/03/2016   Procedure: CESAREAN SECTION;  Surgeon: Edwinna Areolaecilia Worema Banga, DO;  Location: [facility];  Service: Obstetrics;  Laterality: N/A;  . CHOLECYSTECTOMY    . KIDNEY STONE SURGERY    . WISDOM TOOTH EXTRACTION       OB History    Gravida  1   Para  1   Term  1   Preterm      AB      Living  1     SAB      TAB      Ectopic      Multiple  0  Live Births  1            Home Medications    Prior to Admission medications   Medication Sig Start Date End Date Taking? Authorizing Provider  calcium carbonate (TUMS - DOSED IN MG ELEMENTAL CALCIUM) 500 MG chewable tablet Chew 1-2 tablets by mouth as needed for indigestion or heartburn.     [provider]  enoxaparin (LOVENOX) 100 MG/ML injection Inject 1 mL (100 mg total) into the skin every 12 (twelve) hours. 01/14/16 01/28/16  Paula Compton, MD  escitalopram (LEXAPRO) 10 MG tablet Take 10 mg by mouth daily.    [provider]  ibuprofen (ADVIL,MOTRIN) 800 MG tablet Take 1 tablet (800 mg total) by mouth 3 (three) times daily. 11/03/16   Montine Circle, PA-C   omeprazole (PRILOSEC) 20 MG capsule Take 1 capsule (20 mg total) by mouth daily for 14 days. 02/28/19 03/14/19  Amen Dargis, Maree Erie A, PA-C    Family History No family history on file.  Social History Social History   Tobacco Use  . Smoking status: Never Smoker  . Smokeless tobacco: Never Used  Substance Use Topics  . Alcohol use: Yes  . Drug use: Not on file     Allergies   Bee venom   Review of Systems Review of Systems  Constitutional: Positive for chills. Negative for activity change, fatigue and fever.  HENT: Negative for congestion and sore throat.   Respiratory: Negative for cough, shortness of breath and wheezing.   Cardiovascular: Positive for chest pain. Negative for palpitations and leg swelling.  Gastrointestinal: Positive for nausea. Negative for abdominal pain, blood in stool, constipation, diarrhea and vomiting.  Genitourinary: Negative for dysuria.  Musculoskeletal: Negative for back pain, myalgias and neck pain.  Skin: Negative for rash.  Allergic/Immunologic: Negative for immunocompromised state.  Neurological: Positive for dizziness and headaches (chronic). Negative for seizures, syncope, weakness and numbness.  Psychiatric/Behavioral: Negative for confusion.     Physical Exam Updated Vital Signs BP 106/69   Pulse 95   Temp 98.4 F (36.9 C) (Oral)   Resp 18   SpO2 97%   Physical Exam Vitals signs and nursing note reviewed.  Constitutional:      General: She is not in acute distress.    Appearance: She is obese. She is not ill-appearing, toxic-appearing or diaphoretic.     Comments: Well appearing. NAD.   HENT:     Head: Normocephalic.  Eyes:     Conjunctiva/sclera: Conjunctivae normal.  Neck:     Musculoskeletal: Neck supple.  Cardiovascular:     Rate and Rhythm: Normal rate and regular rhythm.     Pulses: Normal pulses.     Heart sounds: Normal heart sounds. No murmur. No friction rub. No gallop.   Pulmonary:     Effort: Pulmonary effort  is normal. No respiratory distress.     Breath sounds: No stridor. No wheezing, rhonchi or rales.     Comments: Breath sounds are clear and equal bilaterally.  No increased work of breathing.  No respiratory distress.  No reproducible tenderness to the chest wall. Chest:     Chest wall: No tenderness.  Abdominal:     General: There is no distension.     Palpations: Abdomen is soft.     Tenderness: There is abdominal tenderness.     Comments: Tender to palpation in the epigastric region.  Abdomen is obese, but soft and nondistended.  No rebound or guarding.  Negative Murphy sign.  No CVA tenderness  bilaterally.  Musculoskeletal:     Right lower leg: No edema.     Left lower leg: No edema.     Comments: No swelling or pain noted to the bilateral lower extremities.  Skin:    General: Skin is warm.     Findings: No rash.  Neurological:     Mental Status: She is alert.  Psychiatric:        Behavior: Behavior normal.      ED Treatments / Results  Labs (all labs ordered are listed, but only abnormal results are displayed) Labs Reviewed  BASIC METABOLIC PANEL - Abnormal; Notable for the following components:      Result Value   Glucose, Bld 128 (*)    All other components within normal limits  CBC - Abnormal; Notable for the following components:   WBC 11.1 (*)    All other components within normal limits  HEPATIC FUNCTION PANEL - Abnormal; Notable for the following components:   ALT 49 (*)    All other components within normal limits  LIPASE, BLOOD  D-DIMER, QUANTITATIVE (NOT AT [facility])  I-STAT BETA HCG BLOOD, ED ([facility], [facility], [facility] ONLY)  TROPONIN I (HIGH SENSITIVITY)  TROPONIN I (HIGH SENSITIVITY)    EKG EKG Interpretation  Date/Time:  Friday February 27 2019 21:46:22 EST Ventricular Rate:  98 PR Interval:  120 QRS Duration: 84 QT Interval:  372 QTC Calculation: 474 R Axis:   42 Text Interpretation: Normal sinus rhythm Normal ECG Confirmed by Gilda Crease [phone]) on  02/28/2019 1:01:12 AM   Radiology Dg Chest 2 View  Result Date: 02/27/2019 CLINICAL DATA:  Chest pain EXAM: CHEST - 2 VIEW COMPARISON:  06/03/2018 FINDINGS: Heart and mediastinal contours are within normal limits. No focal opacities or effusions. No acute bony abnormality. IMPRESSION: No active cardiopulmonary disease. Electronically Signed   By: Charlett Nose M.D.   On: 02/27/2019 22:30    Procedures Procedures (including critical care time)  Medications Ordered in ED Medications  alum & mag hydroxide-simeth (MAALOX/MYLANTA) 200-200-20 MG/5ML suspension 30 mL (30 mLs Oral Given 02/28/19 0112)    And  lidocaine (XYLOCAINE) 2 % viscous mouth solution 15 mL (15 mLs Oral Given 02/28/19 0112)     Initial Impression / Assessment and Plan / ED Course  I have reviewed the triage vital signs and the nursing notes.  Pertinent labs & imaging results that were available during my care of the patient were reviewed by me and considered in my medical decision making (see chart for details).        28 year old female with history of GERD presenting with intermittent episodes of squeezing central chest pain that began yesterday morning that will intermittently radiate to her back accompanied by nausea and intermittent dizziness.  Dizziness is since resolved.  No treatment prior to arrival.  Vital signs are reassuring.  On exam, she is well-appearing.  She does have epigastric tenderness to palpation, but no peritoneal signs.  No right upper quadrant tenderness and negative Murphy sign.  Labs are notable for a minimally elevated ALT, but are otherwise unremarkable.  She did have an unprovoked DVT several years ago, but D-dimer is negative.  Low suspicion for PE at this time as she does not have any risk factors aside from previous DVT.  EKG with normal sinus rhythm.  Delta troponin is unremarkable.  Chest x-ray is normal.  Low suspicion for ACS, tension pneumothorax, pneumonia, aortic dissection, or  esophageal rupture.  She was given a GI cocktail  in the ER with significant improvement in her pain.  Question if she is having esophageal spasms.  Could also consider gastritis or peptic ulcer disease.  Doubt pancreatitis, cholecystitis, ruptured peptic ulcer. She is not currently taking any medications for GERD we will trial her on omeprazole and provide her with GI follow-up.  She is hemodynamically stable and in no acute distress.  ER return precautions given.  Safe for discharge to home with outpatient follow-up as indicated.  Final Clinical Impressions(s) / ED Diagnoses   Final diagnoses:  Dyspepsia    ED Discharge Orders         Ordered    omeprazole (PRILOSEC) 20 MG capsule  Daily     02/28/19 0158           Frederik Pear A, PA-C 02/28/19 0604    Gilda Crease, MD 02/28/19 [phone]

## 2019-07-26 ENCOUNTER — Encounter (HOSPITAL_COMMUNITY): Payer: Self-pay | Admitting: Emergency Medicine

## 2019-07-26 ENCOUNTER — Emergency Department (HOSPITAL_COMMUNITY)
Admission: EM | Admit: 2019-07-26 | Discharge: 2019-07-26 | Disposition: A | Discharge status: Home / Self Care | Payer: PRIVATE HEALTH INSURANCE | Attending: Emergency Medicine | Admitting: Emergency Medicine

## 2019-07-26 ENCOUNTER — Emergency Department (HOSPITAL_COMMUNITY): Payer: PRIVATE HEALTH INSURANCE

## 2019-07-26 ENCOUNTER — Other Ambulatory Visit: Payer: Self-pay

## 2019-07-26 DIAGNOSIS — Y999 Unspecified external cause status: Secondary | ICD-10-CM | POA: Insufficient documentation

## 2019-07-26 DIAGNOSIS — Z7901 Long term (current) use of anticoagulants: Secondary | ICD-10-CM | POA: Diagnosis not present

## 2019-07-26 DIAGNOSIS — S0990XA Unspecified injury of head, initial encounter: Secondary | ICD-10-CM | POA: Diagnosis present

## 2019-07-26 DIAGNOSIS — Z79899 Other long term (current) drug therapy: Secondary | ICD-10-CM | POA: Diagnosis not present

## 2019-07-26 DIAGNOSIS — M25551 Pain in right hip: Secondary | ICD-10-CM | POA: Diagnosis not present

## 2019-07-26 DIAGNOSIS — Y92410 Unspecified street and highway as the place of occurrence of the external cause: Secondary | ICD-10-CM | POA: Insufficient documentation

## 2019-07-26 DIAGNOSIS — M25512 Pain in left shoulder: Secondary | ICD-10-CM | POA: Diagnosis not present

## 2019-07-26 DIAGNOSIS — Y9389 Activity, other specified: Secondary | ICD-10-CM | POA: Insufficient documentation

## 2019-07-26 LAB — BASIC METABOLIC PANEL
Anion gap: 7 (ref 5–15)
BUN: 10 mg/dL (ref 6–20)
CO2: 27 mmol/L (ref 22–32)
Calcium: 9.4 mg/dL (ref 8.9–10.3)
Chloride: 110 mmol/L (ref 98–111)
Creatinine, Ser: 0.63 mg/dL (ref 0.44–1.00)
GFR calc Af Amer: >60 mL/min (ref >60)
GFR calc non Af Amer: >60 mL/min (ref >60)
Glucose, Bld: 176 mg/dL — ABNORMAL HIGH (ref 70–99)
Potassium: 3.9 mmol/L (ref 3.5–5.1)
Sodium: 144 mmol/L (ref 135–145)

## 2019-07-26 LAB — CBC
HCT: 46.1 % — ABNORMAL HIGH (ref 36.0–46.0)
Hemoglobin: 14.8 g/dL (ref 12.0–15.0)
MCH: 30.5 pg (ref 26.0–34.0)
MCHC: 32.1 g/dL (ref 30.0–36.0)
MCV: 94.9 fL (ref 80.0–100.0)
Platelets: 321 10*3/uL (ref 150–400)
RBC: 4.86 MIL/uL (ref 3.87–5.11)
RDW: 12.7 % (ref 11.5–15.5)
WBC: 12.9 10*3/uL — ABNORMAL HIGH (ref 4.0–10.5)
nRBC: 0 % (ref 0.0–0.2)

## 2019-07-26 LAB — I-STAT BETA HCG BLOOD, ED (MC, WL, AP ONLY): I-stat hCG, quantitative: <5 m[IU]/mL (ref <5)

## 2019-07-26 LAB — POC URINE PREG, ED: Preg Test, Ur: NEGATIVE

## 2019-07-26 MED ORDER — SODIUM CHLORIDE 0.9% FLUSH: 3.0000 mL | Freq: Once | INTRAVENOUS | Status: DC
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
## 2019-07-26 NOTE — ED Notes (Signed)
Pt d/c home per MD order. Discharge summary reviewed with pt, pt verbalizes understanding. Off unit via wheelchair. Pt discharged home with aunt
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
## 2024-01-23 ENCOUNTER — Emergency Department (HOSPITAL_BASED_OUTPATIENT_CLINIC_OR_DEPARTMENT_OTHER)
Admission: EM | Admit: 2024-01-23 | Discharge: 2024-01-23 | Disposition: A | Discharge status: Home / Self Care | Attending: Emergency Medicine | Admitting: Emergency Medicine

## 2024-01-23 ENCOUNTER — Other Ambulatory Visit: Payer: Self-pay

## 2024-01-23 ENCOUNTER — Encounter (HOSPITAL_BASED_OUTPATIENT_CLINIC_OR_DEPARTMENT_OTHER): Payer: Self-pay

## 2024-01-23 DIAGNOSIS — R197 Diarrhea, unspecified: Secondary | ICD-10-CM | POA: Diagnosis present

## 2024-01-23 DIAGNOSIS — R1013 Epigastric pain: Secondary | ICD-10-CM | POA: Diagnosis not present

## 2024-01-23 DIAGNOSIS — R110 Nausea: Secondary | ICD-10-CM | POA: Insufficient documentation

## 2024-01-23 DIAGNOSIS — R109 Unspecified abdominal pain: Secondary | ICD-10-CM

## 2024-01-23 LAB — COMPREHENSIVE METABOLIC PANEL WITH GFR
ALT: 26 U/L (ref 0–44)
AST: 21 U/L (ref 15–41)
Albumin: 4.4 g/dL (ref 3.5–5.0)
Alkaline Phosphatase: 87 U/L (ref 38–126)
Anion gap: 12 (ref 5–15)
BUN: 19 mg/dL (ref 6–20)
CO2: 24 mmol/L (ref 22–32)
Calcium: 10 mg/dL (ref 8.9–10.3)
Chloride: 105 mmol/L (ref 98–111)
Creatinine, Ser: 0.76 mg/dL (ref 0.44–1.00)
GFR, Estimated: >60 mL/min (ref >60)
Glucose, Bld: 91 mg/dL (ref 70–99)
Potassium: 3.6 mmol/L (ref 3.5–5.1)
Sodium: 141 mmol/L (ref 135–145)
Total Bilirubin: 0.5 mg/dL (ref 0.0–1.2)
Total Protein: 7.7 g/dL (ref 6.5–8.1)

## 2024-01-23 LAB — CBC WITH DIFFERENTIAL/PLATELET
Abs Immature Granulocytes: 0.01 K/uL (ref 0.00–0.07)
Basophils Absolute: 0.1 K/uL (ref 0.0–0.1)
Basophils Relative: 1 %
Eosinophils Absolute: 0.2 K/uL (ref 0.0–0.5)
Eosinophils Relative: 2 %
HCT: 41.8 % (ref 36.0–46.0)
Hemoglobin: 14 g/dL (ref 12.0–15.0)
Immature Granulocytes: 0 %
Lymphocytes Relative: 36 %
Lymphs Abs: 3.4 K/uL (ref 0.7–4.0)
MCH: 30 pg (ref 26.0–34.0)
MCHC: 33.5 g/dL (ref 30.0–36.0)
MCV: 89.7 fL (ref 80.0–100.0)
Monocytes Absolute: 0.5 K/uL (ref 0.1–1.0)
Monocytes Relative: 5 %
Neutro Abs: 5.3 K/uL (ref 1.7–7.7)
Neutrophils Relative %: 56 %
Platelets: 291 K/uL (ref 150–400)
RBC: 4.66 MIL/uL (ref 3.87–5.11)
RDW: 12.3 % (ref 11.5–15.5)
WBC: 9.4 K/uL (ref 4.0–10.5)
nRBC: 0 % (ref 0.0–0.2)

## 2024-01-23 LAB — URINALYSIS, ROUTINE W REFLEX MICROSCOPIC
Bilirubin Urine: NEGATIVE
Glucose, UA: NEGATIVE mg/dL
Hgb urine dipstick: NEGATIVE
Ketones, ur: NEGATIVE mg/dL
Leukocytes,Ua: NEGATIVE
Nitrite: NEGATIVE
Protein, ur: NEGATIVE mg/dL
Specific Gravity, Urine: 1.026 (ref 1.005–1.030)
pH: 5.5 (ref 5.0–8.0)

## 2024-01-23 LAB — PREGNANCY, URINE: Preg Test, Ur: NEGATIVE

## 2024-01-23 LAB — LIPASE, BLOOD: Lipase: 36 U/L (ref 11–51)

## 2024-01-23 MED ORDER — SODIUM CHLORIDE 0.9 % IV BOLUS
1000.0000 mL | Freq: Once | INTRAVENOUS | Status: AC
  Administered 2024-01-23: 1000 mL via INTRAVENOUS

## 2024-01-23 MED ORDER — PANTOPRAZOLE SODIUM 40 MG IV SOLR
40.0000 mg | Freq: Once | INTRAVENOUS | Status: AC
  Administered 2024-01-23: 40 mg via INTRAVENOUS
  Filled 2024-01-23: qty 10

## 2024-01-23 MED ORDER — KETOROLAC TROMETHAMINE 15 MG/ML IJ SOLN
15.0000 mg | Freq: Once | INTRAMUSCULAR | Status: AC
  Administered 2024-01-23: 15 mg via INTRAVENOUS
  Filled 2024-01-23: qty 1

## 2024-01-23 NOTE — Discharge Instructions (Addendum)
 Follow-up with your PCP if your symptoms persist.  Avoid use of Imodium , you may use Bentyl as needed for abdominal cramping.  Continue to push fluids to avoid dehydration. Return to the emergency department if your symptoms worsen.

## 2024-01-23 NOTE — ED Triage Notes (Addendum)
 Arrives ambulatory to the ED with complaints of ongoing diarrhea and upper abdominal pain x3 days.

## 2024-01-23 NOTE — ED Provider Notes (Signed)
 [place] EMERGENCY DEPARTMENT AT [facility] Provider Note   CSN: [id number] Arrival date & time: 01/23/24  1402     Patient presents with: Diarrhea and Abdominal Pain   Lauren Ross is a 33 y.o. female.   33 year old female presenting with 4 days of diarrhea.  Patient denies any new medications or recent travel.  Patient does work in the field of abdominal surgeries, reports that they have not had a C. difficile positive patient in several weeks so she does not believe that her symptoms are attributed to C. difficile.  She notes frequent episodes of diarrhea almost immediately after eating.  She is on Mounjaro but has not recently increased her dosage.  Reports some epigastric abdominal pain that is dull, at times it is as severe as a 5/10.  History of cholecystectomy and C-section x 2.  Endorses occasional nausea as well as reflux type symptoms, no vomiting.  Denies chest pain, shortness of breath, fever, hematochezia/melena, dysuria.    Diarrhea Associated symptoms: abdominal pain   Abdominal Pain Associated symptoms: diarrhea        Prior to Admission medications   Medication Sig Start Date End Date Taking? Authorizing Provider  ARIPiprazole (ABILIFY) 2 MG tablet Take 2 mg by mouth daily.    [provider]  enoxaparin  (LOVENOX ) 100 MG/ML injection Inject 1 mL (100 mg total) into the skin every 12 (twelve) hours. Patient not taking: Reported on 07/26/2019 01/14/16 01/28/16  Estelle Service, MD  escitalopram  (LEXAPRO ) 20 MG tablet Take 20 mg by mouth daily.    [provider]  ibuprofen  (ADVIL ) 200 MG tablet Take 200 mg by mouth every 6 (six) hours as needed.    [provider]  ibuprofen  (ADVIL ,MOTRIN ) 800 MG tablet Take 1 tablet (800 mg total) by mouth 3 (three) times daily. Patient not taking: Reported on 07/26/2019 11/03/16   Vicky Charleston, PA-C  omeprazole  (PRILOSEC) 20 MG capsule Take 1 capsule (20 mg total) by mouth daily for 14  days. 02/28/19 03/14/19  McDonald, Mia A, PA-C    Allergies: Sulfa antibiotics, Bee venom, and Pineapple    Review of Systems  Gastrointestinal:  Positive for abdominal pain and diarrhea.    Updated Vital Signs  Vitals:   01/23/24 1412 01/23/24 1412 01/23/24 1412 01/23/24 1425  BP:   119/85   Pulse: 92  90   Resp:   20   Temp:   97.9 F (36.6 C)   SpO2: 100%   100%  Weight:  109 kg    Height:  5' 2 (1.575 m)       Physical Exam Vitals and nursing note reviewed.  Constitutional:      Appearance: She is not ill-appearing or toxic-appearing.  HENT:     Head: Normocephalic.  Eyes:     Extraocular Movements: Extraocular movements intact.  Cardiovascular:     Rate and Rhythm: Normal rate and regular rhythm.     Heart sounds: Normal heart sounds.  Pulmonary:     Effort: Pulmonary effort is normal.     Breath sounds: Normal breath sounds.  Abdominal:     Palpations: Abdomen is soft.     Tenderness: There is abdominal tenderness (mild, epigastric region and LUQ). There is no guarding.     Comments: Hyperactive BS  Musculoskeletal:     Cervical back: Normal range of motion.     Comments: Moves all extremities spontaneously without difficulty  Skin:    General: Skin is warm and dry.  Neurological:  Mental Status: She is alert and oriented to person, place, and time.     (all labs ordered are listed, but only abnormal results are displayed) Labs Reviewed  COMPREHENSIVE METABOLIC PANEL WITH GFR  LIPASE, BLOOD  CBC WITH DIFFERENTIAL/PLATELET  URINALYSIS, ROUTINE W REFLEX MICROSCOPIC  PREGNANCY, URINE    EKG: None  Radiology: No results found.   Procedures   Medications Ordered in the ED  ketorolac  (TORADOL ) 15 MG/ML injection 15 mg (has no administration in time range)  pantoprazole (PROTONIX) injection 40 mg (40 mg Intravenous Given 01/23/24 1502)  sodium chloride  0.9 % bolus 1,000 mL (0 mLs Intravenous Stopped 01/23/24 1615)                                     Medical Decision Making This patient presents to the ED for concern of diarrhea and abdominal pain, this involves an extensive number of treatment options, and is a complaint that carries with it a high risk of complications and morbidity.  The differential diagnosis includes gastroenteritis, diverticulitis, pancreatitis, appendicitis, other infectious diarrheal illness   Co morbidities that complicate the patient evaluation  MDD   Additional history obtained:  Additional history obtained from record review External records from outside source obtained and reviewed including prior ED notes   Lab Tests:  I Ordered, and personally interpreted labs.  The pertinent results include: Urinalysis within normal limits, urine hCG negative.  CBC unremarkable, no leukocytosis.  CMP within normal limits, no electrolyte abnormalities.  Lipase within normal limits.  Cardiac Monitoring: / EKG:  The patient was maintained on a cardiac monitor.  I personally viewed and interpreted the cardiac monitored which showed an underlying rhythm of: NSR  Problem List / ED Course / Critical interventions / Medication management  I ordered medication including Protonix for reflux symptoms, IV fluids for rehydration, toradol  for abdominal pain Reevaluation of the patient after these medicines showed that the patient improved I have reviewed the patients home medicines and have made adjustments as needed   Test / Admission - Considered:  Patient is well-appearing, she is afebrile and without tachycardia.  She does endorse some tenderness to palpation in the epigastric region and left upper quadrant, will wait on lab results before discussing utility of further imaging. Labs are largely unremarkable as above, at time my reassessment patient reports that abdominal pain seems to wax and wane but is not severe, I discussed that given her reassuring vitals/labs I do not feel that CT imaging is entirely  necessary, however it is still worth considering given her symptoms over the past several days.  Using shared decision making, patient reports that she does not feel that CT imaging is necessary at this time, which I am in agreement with.  I suspect that her symptoms are likely secondary to a viral etiology, although infectious diarrhea cannot be entirely excluded without further stool testing.  I recommend that she follow-up with her PCP in the event that her symptoms persist, a stool testing may be warranted at that time.  I recommend the patient avoid Imodium , but may try Bentyl to be used as needed for abdominal cramping, she does have some Bentyl available at home.  I recommend that she continue to push fluids to avoid dehydration.  Strict return precautions discussed, she is in agreement with this plan and is appropriate for discharge at this time.    Amount and/or Complexity of  Data Reviewed Labs: ordered.  Risk Prescription drug management.        Final diagnoses:  Diarrhea, unspecified type  Abdominal pain, unspecified abdominal location    ED Discharge Orders     None          Glendia Rocky SAILOR, [place] 01/23/24 1652    Randol Simmonds, MD 01/24/24 1410

## 2024-01-23 NOTE — ED Notes (Signed)
 RN reviewed discharge instructions with pt. Pt verbalized understanding and had no further questions

## 2024-03-15 ENCOUNTER — Encounter (HOSPITAL_COMMUNITY): Payer: Self-pay

## 2024-03-15 ENCOUNTER — Other Ambulatory Visit: Payer: Self-pay

## 2024-03-15 ENCOUNTER — Emergency Department (HOSPITAL_COMMUNITY): Admission: EM | Admit: 2024-03-15 | Discharge: 2024-03-16 | Disposition: A

## 2024-03-15 DIAGNOSIS — R7401 Elevation of levels of liver transaminase levels: Secondary | ICD-10-CM | POA: Diagnosis not present

## 2024-03-15 DIAGNOSIS — R1012 Left upper quadrant pain: Secondary | ICD-10-CM | POA: Insufficient documentation

## 2024-03-15 DIAGNOSIS — R1013 Epigastric pain: Secondary | ICD-10-CM | POA: Insufficient documentation

## 2024-03-15 LAB — CBC WITH DIFFERENTIAL/PLATELET
Abs Immature Granulocytes: 0.02 K/uL (ref 0.00–0.07)
Basophils Absolute: 0 K/uL (ref 0.0–0.1)
Basophils Relative: 0 %
Eosinophils Absolute: 0.1 K/uL (ref 0.0–0.5)
Eosinophils Relative: 1 %
HCT: 43.5 % (ref 36.0–46.0)
Hemoglobin: 14.8 g/dL (ref 12.0–15.0)
Immature Granulocytes: 0 %
Lymphocytes Relative: 43 %
Lymphs Abs: 3.9 K/uL (ref 0.7–4.0)
MCH: 30.3 pg (ref 26.0–34.0)
MCHC: 34 g/dL (ref 30.0–36.0)
MCV: 89 fL (ref 80.0–100.0)
Monocytes Absolute: 0.5 K/uL (ref 0.1–1.0)
Monocytes Relative: 5 %
Neutro Abs: 4.6 K/uL (ref 1.7–7.7)
Neutrophils Relative %: 51 %
Platelets: 309 K/uL (ref 150–400)
RBC: 4.89 MIL/uL (ref 3.87–5.11)
RDW: 12 % (ref 11.5–15.5)
WBC: 9.2 K/uL (ref 4.0–10.5)
nRBC: 0 % (ref 0.0–0.2)

## 2024-03-15 LAB — URINALYSIS, ROUTINE W REFLEX MICROSCOPIC
Bilirubin Urine: NEGATIVE
Glucose, UA: NEGATIVE mg/dL
Hgb urine dipstick: NEGATIVE
Ketones, ur: NEGATIVE mg/dL
Leukocytes,Ua: NEGATIVE
Nitrite: NEGATIVE
Protein, ur: NEGATIVE mg/dL
Specific Gravity, Urine: 1.026 (ref 1.005–1.030)
pH: 5 (ref 5.0–8.0)

## 2024-03-15 LAB — COMPREHENSIVE METABOLIC PANEL WITH GFR
ALT: 50 U/L — ABNORMAL HIGH (ref 0–44)
AST: 41 U/L (ref 15–41)
Albumin: 4.4 g/dL (ref 3.5–5.0)
Alkaline Phosphatase: 97 U/L (ref 38–126)
Anion gap: 10 (ref 5–15)
BUN: 13 mg/dL (ref 6–20)
CO2: 27 mmol/L (ref 22–32)
Calcium: 9.5 mg/dL (ref 8.9–10.3)
Chloride: 105 mmol/L (ref 98–111)
Creatinine, Ser: 0.83 mg/dL (ref 0.44–1.00)
GFR, Estimated: >60 mL/min (ref >60)
Glucose, Bld: 114 mg/dL — ABNORMAL HIGH (ref 70–99)
Potassium: 3.5 mmol/L (ref 3.5–5.1)
Sodium: 142 mmol/L (ref 135–145)
Total Bilirubin: 0.3 mg/dL (ref 0.0–1.2)
Total Protein: 7.7 g/dL (ref 6.5–8.1)

## 2024-03-15 LAB — LIPASE, BLOOD: Lipase: 35 U/L (ref 11–51)

## 2024-03-15 LAB — HCG, SERUM, QUALITATIVE: Preg, Serum: NEGATIVE

## 2024-03-15 MED ORDER — MORPHINE SULFATE (PF) 2 MG/ML IV SOLN
2.0000 mg | Freq: Once | INTRAVENOUS | Status: AC
  Administered 2024-03-15: 2 mg via INTRAVENOUS
  Filled 2024-03-15: qty 1

## 2024-03-15 MED ORDER — ONDANSETRON HCL 4 MG/2ML IJ SOLN
4.0000 mg | Freq: Once | INTRAMUSCULAR | Status: AC
  Administered 2024-03-15: 4 mg via INTRAVENOUS
  Filled 2024-03-15: qty 2

## 2024-03-15 NOTE — ED Triage Notes (Signed)
 Pt reports with LUQ pain that worsens when she eats or drinks for a while now. Pt states that it is a burning pain.

## 2024-03-15 NOTE — ED Provider Notes (Signed)
 [place] EMERGENCY DEPARTMENT AT [facility] Provider Note   CSN: [id number] Arrival date & time: 03/15/24  2113     Patient presents with: Abdominal Pain   Lauren Ross is a 33 y.o. female.  Past medical history significant for PCOS and cholecystectomy presents today for left upper quadrant abdominal pain that worsens after eating for several months now.  Patient reports the pain is a burning type pain.  Patient does have intermittent diarrhea and constipation which is not new.  Patient denies nausea, vomiting, fever, chills, urinary symptoms, chest pain, or shortness of breath.  Patient is scheduled to follow-up with GI with [facility] in the upcoming months.    Abdominal Pain      Prior to Admission medications   Medication Sig Start Date End Date Taking? Authorizing Provider  ARIPiprazole (ABILIFY) 2 MG tablet Take 2 mg by mouth daily.    [provider]  enoxaparin  (LOVENOX ) 100 MG/ML injection Inject 1 mL (100 mg total) into the skin every 12 (twelve) hours. Patient not taking: Reported on 07/26/2019 01/14/16 01/28/16  Estelle Service, MD  escitalopram  (LEXAPRO ) 20 MG tablet Take 20 mg by mouth daily.    [provider]  ibuprofen  (ADVIL ) 200 MG tablet Take 200 mg by mouth every 6 (six) hours as needed.    [provider]  ibuprofen  (ADVIL ,MOTRIN ) 800 MG tablet Take 1 tablet (800 mg total) by mouth 3 (three) times daily. Patient not taking: Reported on 07/26/2019 11/03/16   Vicky Charleston, PA-C  omeprazole  (PRILOSEC) 20 MG capsule Take 1 capsule (20 mg total) by mouth daily for 14 days. 02/28/19 03/14/19  McDonald, Mia A, PA-C    Allergies: Sulfa antibiotics, Bee venom, and Pineapple    Review of Systems  Gastrointestinal:  Positive for abdominal pain.    Updated Vital Signs BP 124/80   Pulse 99   Temp 98 F (36.7 C) (Oral)   Resp 20   SpO2 97%   Physical Exam Vitals and nursing note reviewed.  Constitutional:      General:  She is not in acute distress.    Appearance: She is well-developed.  HENT:     Head: Normocephalic and atraumatic.  Eyes:     Conjunctiva/sclera: Conjunctivae normal.  Cardiovascular:     Rate and Rhythm: Normal rate and regular rhythm.     Heart sounds: Normal heart sounds. No murmur heard. Pulmonary:     Effort: Pulmonary effort is normal. No respiratory distress.     Breath sounds: Normal breath sounds.  Abdominal:     General: There is no distension.     Palpations: Abdomen is soft.     Tenderness: There is abdominal tenderness in the epigastric area and left upper quadrant. Negative signs include Murphy's sign, Rovsing's sign and McBurney's sign.  Musculoskeletal:        General: No swelling.     Cervical back: Neck supple.  Skin:    General: Skin is warm and dry.     Capillary Refill: Capillary refill takes less than 2 seconds.  Neurological:     Mental Status: She is alert.  Psychiatric:        Mood and Affect: Mood normal.     (all labs ordered are listed, but only abnormal results are displayed) Labs Reviewed  COMPREHENSIVE METABOLIC PANEL WITH GFR - Abnormal; Notable for the following components:      Result Value   Glucose, Bld 114 (*)    ALT 50 (*)  All other components within normal limits  URINALYSIS, ROUTINE W REFLEX MICROSCOPIC - Abnormal; Notable for the following components:   APPearance HAZY (*)    All other components within normal limits  LIPASE, BLOOD  CBC WITH DIFFERENTIAL/PLATELET  HCG, SERUM, QUALITATIVE    EKG: None  Radiology: No results found.   Procedures   Medications Ordered in the ED  ondansetron  (ZOFRAN ) injection 4 mg (has no administration in time range)  morphine  (PF) 2 MG/ML injection 2 mg (has no administration in time range)                                    Medical Decision Making Amount and/or Complexity of Data Reviewed Labs: ordered.   This patient presents to the ED for concern of abdominal pain  differential diagnosis includes diverticulitis, appendicitis, choledocholithiasis, acute cholecystitis, GERD, pancreatitis, SBO, viral GI illness   Additional history obtained   Additional history obtained from Electronic Medical Record External records from outside source obtained and reviewed including Care Everywhere   Lab Tests:  I Ordered, and personally interpreted labs.  The pertinent results include: CBC unremarkable, elevated ALT at 50, pregnancy negative, UA unremarkable   Imaging Studies ordered:  I ordered imaging studies including CT abdomen pelvis with contrast I independently visualized and interpreted imaging which showed Pending I agree with the radiologist interpretation   Medicines ordered and prescription drug management:  I ordered medication including morphine  and Zofran     I have reviewed the patients home medicines and have made adjustments as needed   Patient signed out to Ubaldo High, PA-C pending CT abdomen pelvis with contrast which will determine patient disposition.  Please refer to their note for full ED course.     Final diagnoses:  None    ED Discharge Orders     None          Francis Ileana SAILOR, PA-C 03/15/24 2343    Neysa Caron PARAS, DO 03/15/24 2352

## 2024-03-16 ENCOUNTER — Emergency Department (HOSPITAL_COMMUNITY)

## 2024-03-16 MED ORDER — IOHEXOL 300 MG/ML  SOLN
100.0000 mL | Freq: Once | INTRAMUSCULAR | Status: AC | PRN
  Administered 2024-03-16: 100 mL via INTRAVENOUS

## 2024-03-16 MED ORDER — FAMOTIDINE 20 MG PO TABS: 20.0000 mg | ORAL_TABLET | Freq: Two times a day (BID) | ORAL | 0 refills | Status: AC

## 2024-03-16 MED ORDER — OMEPRAZOLE 20 MG PO CPDR: 20.0000 mg | DELAYED_RELEASE_CAPSULE | Freq: Every day | ORAL | 0 refills | Status: AC

## 2024-03-16 NOTE — Discharge Instructions (Addendum)
 I have prescribed Prilosec and Pepcid to help with your stomach discomfort.  Please follow-up with primary care and gastroenterology for further evaluation as needed.  Return to the emergency department if you develop any life-threatening symptoms

## 2024-03-16 NOTE — ED Provider Notes (Signed)
  Physical Exam  BP 112/78   Pulse 88   Temp 98 F (36.7 C) (Oral)   Resp 18   SpO2 96%   Physical Exam  Procedures  Procedures  ED Course / MDM    Medical Decision Making Amount and/or Complexity of Data Reviewed Labs: ordered. Radiology: ordered.  Risk Prescription drug management.   Patient care assumed at shift handoff from previous provider.  See her note for full details.  In short, patient is a 33 year old female with past medical history seen for PCOS and cholecystectomy.  She presented complaining of upper left quadrant abdominal pain worsened after eating which has been ongoing for several months.  The pain is described as a burning pain.  She also endorses some epigastric pain.  She denied nausea, vomiting, fever, chills, urinary symptoms, shortness of breath, chest pain.  At time of shift handoff CT abdomen pelvis was ordered to rule out any life-threatening acute process.  If negative plan to discharge home.  Patient has plans for GI follow-up.  CT abdomen pelvis with no acute findings.  Patient prescribed Protonix  and Pepcid.  Patient to follow-up with primary care and gastroenterology.  Discharged home in stable condition.       Lauren Ross KATHEE DEVONNA 03/16/24 [id number]    Jerral Meth, MD 03/16/24 [id number]

## 2024-03-21 ENCOUNTER — Emergency Department (HOSPITAL_COMMUNITY)
Admission: EM | Admit: 2024-03-21 | Discharge: 2024-03-21 | Disposition: A | Discharge status: Home / Self Care | Attending: Emergency Medicine | Admitting: Emergency Medicine

## 2024-03-21 ENCOUNTER — Other Ambulatory Visit: Payer: Self-pay

## 2024-03-21 ENCOUNTER — Encounter (HOSPITAL_COMMUNITY): Payer: Self-pay | Admitting: Pharmacy Technician

## 2024-03-21 ENCOUNTER — Other Ambulatory Visit (HOSPITAL_BASED_OUTPATIENT_CLINIC_OR_DEPARTMENT_OTHER): Payer: Self-pay

## 2024-03-21 DIAGNOSIS — R1012 Left upper quadrant pain: Secondary | ICD-10-CM

## 2024-03-21 LAB — CBC
HCT: 43.6 % (ref 36.0–46.0)
Hemoglobin: 14.7 g/dL (ref 12.0–15.0)
MCH: 29.9 pg (ref 26.0–34.0)
MCHC: 33.7 g/dL (ref 30.0–36.0)
MCV: 88.6 fL (ref 80.0–100.0)
Platelets: 289 K/uL (ref 150–400)
RBC: 4.92 MIL/uL (ref 3.87–5.11)
RDW: 12.2 % (ref 11.5–15.5)
WBC: 10.8 K/uL — ABNORMAL HIGH (ref 4.0–10.5)
nRBC: 0 % (ref 0.0–0.2)

## 2024-03-21 LAB — COMPREHENSIVE METABOLIC PANEL WITH GFR
ALT: 30 U/L (ref 0–44)
AST: 22 U/L (ref 15–41)
Albumin: 3.9 g/dL (ref 3.5–5.0)
Alkaline Phosphatase: 69 U/L (ref 38–126)
Anion gap: 12 (ref 5–15)
BUN: 10 mg/dL (ref 6–20)
CO2: 22 mmol/L (ref 22–32)
Calcium: 9.1 mg/dL (ref 8.9–10.3)
Chloride: 108 mmol/L (ref 98–111)
Creatinine, Ser: 0.76 mg/dL (ref 0.44–1.00)
GFR, Estimated: >60 mL/min (ref >60)
Glucose, Bld: 103 mg/dL — ABNORMAL HIGH (ref 70–99)
Potassium: 3.7 mmol/L (ref 3.5–5.1)
Sodium: 142 mmol/L (ref 135–145)
Total Bilirubin: 0.9 mg/dL (ref 0.0–1.2)
Total Protein: 7.7 g/dL (ref 6.5–8.1)

## 2024-03-21 LAB — URINALYSIS, ROUTINE W REFLEX MICROSCOPIC
Bilirubin Urine: NEGATIVE
Glucose, UA: NEGATIVE mg/dL
Hgb urine dipstick: NEGATIVE
Ketones, ur: NEGATIVE mg/dL
Nitrite: NEGATIVE
Protein, ur: NEGATIVE mg/dL
Specific Gravity, Urine: 1.021 (ref 1.005–1.030)
pH: 5 (ref 5.0–8.0)

## 2024-03-21 LAB — LIPASE, BLOOD: Lipase: 33 U/L (ref 11–51)

## 2024-03-21 LAB — HCG, SERUM, QUALITATIVE: Preg, Serum: NEGATIVE

## 2024-03-21 MED ORDER — ONDANSETRON 4 MG PO TBDP
4.0000 mg | ORAL_TABLET | Freq: Three times a day (TID) | ORAL | 0 refills | Status: DC | PRN
  Filled 2024-03-21: qty 20, 7d supply, fill #0

## 2024-03-21 MED ORDER — PANTOPRAZOLE SODIUM 40 MG IV SOLR
40.0000 mg | Freq: Once | INTRAVENOUS | Status: AC
  Administered 2024-03-21: 40 mg via INTRAVENOUS
  Filled 2024-03-21: qty 10

## 2024-03-21 MED ORDER — ONDANSETRON 4 MG PO TBDP: 4.0000 mg | ORAL_TABLET | Freq: Three times a day (TID) | ORAL | 0 refills | Status: AC | PRN

## 2024-03-21 MED ORDER — MORPHINE SULFATE (PF) 2 MG/ML IV SOLN
2.0000 mg | Freq: Once | INTRAVENOUS | Status: DC
  Filled 2024-03-21: qty 1

## 2024-03-21 MED ORDER — SODIUM CHLORIDE 0.9 % IV BOLUS
1000.0000 mL | Freq: Once | INTRAVENOUS | Status: AC
  Administered 2024-03-21: 1000 mL via INTRAVENOUS

## 2024-03-21 MED ORDER — OXYCODONE-ACETAMINOPHEN 5-325 MG PO TABS
1.0000 | ORAL_TABLET | Freq: Four times a day (QID) | ORAL | 0 refills | Status: DC | PRN
  Filled 2024-03-21: qty 6, 2d supply, fill #0

## 2024-03-21 MED ORDER — ALUM & MAG HYDROXIDE-SIMETH 200-200-20 MG/5ML PO SUSP
30.0000 mL | Freq: Once | ORAL | Status: AC
  Administered 2024-03-21: 30 mL via ORAL
  Filled 2024-03-21: qty 30

## 2024-03-21 MED ORDER — OXYCODONE-ACETAMINOPHEN 5-325 MG PO TABS: 1.0000 | ORAL_TABLET | Freq: Four times a day (QID) | ORAL | 0 refills | Status: AC | PRN

## 2024-03-21 NOTE — Discharge Instructions (Signed)
 It was a pleasure taking care of you today.  Your workup was reassuring.  As we discussed, I did treat your pain and nausea while you are in the emergency department.  I we will give you a short-term course of Percocet, for your pain until you are able to see GI on Monday.  I have also sent over a refill of your Zofran .  Please return to the emergency department if you have any worsening chest pain, shortness of breath, intractable vomiting, seizure, or any other life-threatening illness.

## 2024-03-21 NOTE — ED Triage Notes (Signed)
 Pt here POV for reports of L upper abdominal pain that comes in waves. States the pain makes her double over. Endorses some nausea but states she thinks that it is due to the severity of the pain. Denies dysuria. Seen at [facility] earlier this week for same.

## 2024-03-21 NOTE — ED Triage Notes (Signed)
 PT states L sided abdominal pain that comes in waves.  Pain started last night.  Denies urinary symptoms.

## 2024-03-21 NOTE — ED Provider Notes (Signed)
 [place] EMERGENCY DEPARTMENT AT [facility] Provider Note   CSN: [id number] Arrival date & time: 03/21/24  1143     Patient presents with: Abdominal Pain   Lauren Ross is a 33 y.o. female with past pertinent medical history of cholecystectomy, 2 C-sections and frequent recent emergency department visits for abdominal pain, who presents emergency department for evaluation of left-sided abdominal pain.  Patient reports she has been dealing with this pain since August, and it has been acutely worsening.  She was seen at [facility] on 12/1 for evaluation of this, and is seeing GI on Monday 12/8.  However, patient reports that her pain has worsened since she was seen at [facility] and when she experienced these these stabbing squeezing spells she feels like she is unable to move and that these are debilitating.  Patient has been taking Zofran  intermittently with some mild nausea relief, but does not currently have a prescription.  She received morphine  in the ED at [facility], and stated that was the first time in months that she had no pain.  She reports initially she was going to wait until Monday when she sees GI, but she felt the pain was unbearable.    Abdominal Pain      Prior to Admission medications   Medication Sig Start Date End Date Taking? Authorizing Provider  ARIPiprazole (ABILIFY) 2 MG tablet Take 2 mg by mouth daily.    [provider]  enoxaparin  (LOVENOX ) 100 MG/ML injection Inject 1 mL (100 mg total) into the skin every 12 (twelve) hours. Patient not taking: Reported on 07/26/2019 01/14/16 01/28/16  Estelle Service, MD  escitalopram  (LEXAPRO ) 20 MG tablet Take 20 mg by mouth daily.    [provider]  famotidine  (PEPCID ) 20 MG tablet Take 1 tablet (20 mg total) by mouth 2 (two) times daily. 03/16/24   Logan Ubaldo NOVAK, PA-C  ibuprofen  (ADVIL ) 200 MG tablet Take 200 mg by mouth every 6 (six) hours as needed.    [provider]   ibuprofen  (ADVIL ,MOTRIN ) 800 MG tablet Take 1 tablet (800 mg total) by mouth 3 (three) times daily. Patient not taking: Reported on 07/26/2019 11/03/16   Vicky Charleston, PA-C  omeprazole  (PRILOSEC) 20 MG capsule Take 1 capsule (20 mg total) by mouth daily. 03/16/24 04/15/24  Logan Ubaldo NOVAK, PA-C    Allergies: Sulfa antibiotics, Bee venom, and Pineapple    Review of Systems  Gastrointestinal:  Positive for abdominal pain.    Updated Vital Signs BP 115/78   Pulse 94   Temp 98.2 F (36.8 C) (Oral)   Resp 20   SpO2 100%   Physical Exam Vitals and nursing note reviewed.  Constitutional:      Appearance: Normal appearance.  HENT:     Head: Normocephalic and atraumatic.     Mouth/Throat:     Mouth: Mucous membranes are moist.  Eyes:     General: No scleral icterus.       Right eye: No discharge.        Left eye: No discharge.     Conjunctiva/sclera: Conjunctivae normal.  Cardiovascular:     Rate and Rhythm: Normal rate and regular rhythm.     Pulses: Normal pulses.  Pulmonary:     Effort: Pulmonary effort is normal.     Breath sounds: Normal breath sounds.  Abdominal:     General: There is no distension.     Tenderness: There is abdominal tenderness in the left upper quadrant.  Comments: Left upper quadrant tenderness to palpation.  Musculoskeletal:        General: No deformity.     Cervical back: Normal range of motion.  Skin:    General: Skin is warm and dry.     Capillary Refill: Capillary refill takes less than 2 seconds.  Neurological:     Mental Status: She is alert.     Motor: No weakness.  Psychiatric:        Mood and Affect: Mood normal.     (all labs ordered are listed, but only abnormal results are displayed) Labs Reviewed  COMPREHENSIVE METABOLIC PANEL WITH GFR - Abnormal; Notable for the following components:      Result Value   Glucose, Bld 103 (*)    All other components within normal limits  CBC - Abnormal; Notable for the following  components:   WBC 10.8 (*)    All other components within normal limits  URINALYSIS, ROUTINE W REFLEX MICROSCOPIC - Abnormal; Notable for the following components:   APPearance HAZY (*)    Leukocytes,Ua SMALL (*)    Bacteria, UA RARE (*)    All other components within normal limits  LIPASE, BLOOD  HCG, SERUM, QUALITATIVE    EKG: None  Radiology: No results found.  Procedures   Medications Ordered in the ED  sodium chloride  0.9 % bolus 1,000 mL (1,000 mLs Intravenous New Bag/Given 03/21/24 1456)  alum & mag hydroxide-simeth (MAALOX/MYLANTA) 200-200-20 MG/5ML suspension 30 mL (30 mLs Oral Given 03/21/24 1457)  pantoprazole  (PROTONIX ) injection 40 mg (40 mg Intravenous Given 03/21/24 1455)    Clinical Course as of 03/21/24 1543  Sat Mar 21, 2024  1424 Patient's initial lab work is reassuring.  There is mild evidence of a leukocytosis of 10.8.  Lipase is 33.  Urine is not infected.  Patient did have a CT of her abdomen at [facility] on 12/1, which was negative.  I do not feel the need to repeat this at this time.  Patient does have LUQ tenderness to palpation on exam, and is mildly tachycardic to 107 while laying in bed.  She did receive morphine  and Zofran  at [facility], which she said did help.  However, I am going to try treating her with IV Protonix  and a GI cocktail.  I am also going to give her IV fluids for her tachycardia.  If the GI cocktail and Protonix  fail, I will treat her with 2 mg of morphine  and plan to discharge her with a 3-day supply of Percocet and Zofran .  Patient is scheduled with GI on Monday, so does have close follow-up. [GD]  1540 On reassessment, patient reports her pain has not really improved.  I will give the patient 2 mg of IV morphine .  Plan to discharge patient with a few Percocet and Zofran  for severe pain management till she is able to follow-up with GI on Monday.  Patient's vital signs are stable.  Patient is appropriate for discharge at this time. [GD]     Clinical Course User Index [GD] Torrence Marry RAMAN, PA-C                              Medical Decision Making Amount and/or Complexity of Data Reviewed Labs: ordered.  Risk OTC drugs. Prescription drug management.   33 year old female who presents emergency department for evaluation of LUQ abdominal pain.  Differential diagnosis: GERD, gastric ulcer, UTI, pyelonephritis, pancreatitis  Please see  ED course for full workup.  Final diagnoses:  Left upper quadrant abdominal pain    ED Discharge Orders     None          Torrence Marry RAMAN, [place] 03/21/24 1544

## 2024-03-23 ENCOUNTER — Other Ambulatory Visit (HOSPITAL_BASED_OUTPATIENT_CLINIC_OR_DEPARTMENT_OTHER): Payer: Self-pay
